# Patient Record
Sex: Female | Born: 1960 | Race: White | Hispanic: No | Marital: Married | Smoking: Never smoker
Health system: Southern US, Community
[De-identification: ages and names within clinical notes are randomized; demographics above are authoritative.]

## PROBLEM LIST (undated history)

## (undated) DIAGNOSIS — F419 Anxiety disorder, unspecified: Secondary | ICD-10-CM

## (undated) DIAGNOSIS — J45909 Unspecified asthma, uncomplicated: Secondary | ICD-10-CM

## (undated) DIAGNOSIS — I10 Essential (primary) hypertension: Secondary | ICD-10-CM

## (undated) DIAGNOSIS — E785 Hyperlipidemia, unspecified: Secondary | ICD-10-CM

## (undated) DIAGNOSIS — C801 Malignant (primary) neoplasm, unspecified: Secondary | ICD-10-CM

## (undated) DIAGNOSIS — M1990 Unspecified osteoarthritis, unspecified site: Secondary | ICD-10-CM

## (undated) DIAGNOSIS — E039 Hypothyroidism, unspecified: Secondary | ICD-10-CM

## (undated) HISTORY — DX: Anxiety disorder, unspecified: F41.9

## (undated) HISTORY — PX: TONSILLECTOMY: SUR1361

## (undated) HISTORY — PX: DIAGNOSTIC LAPAROSCOPY: SUR761

## (undated) HISTORY — DX: Hyperlipidemia, unspecified: E78.5

## (undated) HISTORY — PX: MASTECTOMY: SHX3

## (undated) HISTORY — DX: Unspecified osteoarthritis, unspecified site: M19.90

## (undated) HISTORY — PX: OTHER SURGICAL HISTORY: SHX169

## (undated) HISTORY — PX: TUBAL LIGATION: SHX77

---

## 2012-11-10 ENCOUNTER — Emergency Department: Payer: Self-pay | Admitting: Emergency Medicine

## 2012-11-10 ENCOUNTER — Ambulatory Visit: Payer: Self-pay

## 2012-11-10 LAB — COMPREHENSIVE METABOLIC PANEL
Anion Gap: 5 — ABNORMAL LOW (ref 7–16)
BUN: 13 mg/dL (ref 7–18)
Bilirubin,Total: 0.3 mg/dL (ref 0.2–1.0)
EGFR (African American): >60
EGFR (Non-African Amer.): >60
Glucose: 101 mg/dL — ABNORMAL HIGH (ref 65–99)
SGOT(AST): 17 U/L (ref 15–37)
SGPT (ALT): 22 U/L (ref 12–78)
Sodium: 138 mmol/L (ref 136–145)
Total Protein: 7.8 g/dL (ref 6.4–8.2)

## 2012-11-10 LAB — CBC
HCT: 43.5 % (ref 35.0–47.0)
HGB: 15 g/dL (ref 12.0–16.0)
MCH: 30.3 pg (ref 26.0–34.0)
MCHC: 34.5 g/dL (ref 32.0–36.0)
MCV: 88 fL (ref 80–100)
Platelet: 228 10*3/uL (ref 150–440)
RBC: 4.94 10*6/uL (ref 3.80–5.20)
WBC: 6.3 10*3/uL (ref 3.6–11.0)

## 2012-11-10 LAB — TSH: Thyroid Stimulating Horm: 0.121 u[IU]/mL — ABNORMAL LOW

## 2012-11-10 LAB — T4, FREE: Free Thyroxine: 1.15 ng/dL (ref 0.76–1.46)

## 2012-11-10 LAB — TROPONIN I: Troponin-I: <0.02 ng/mL

## 2013-04-27 ENCOUNTER — Ambulatory Visit: Payer: Self-pay | Admitting: Physician Assistant

## 2015-02-04 ENCOUNTER — Encounter: Payer: Self-pay | Admitting: *Deleted

## 2015-02-07 ENCOUNTER — Ambulatory Visit: Payer: 59 | Admitting: Certified Registered Nurse Anesthetist

## 2015-02-07 ENCOUNTER — Ambulatory Visit
Admission: RE | Admit: 2015-02-07 | Discharge: 2015-02-07 | Disposition: A | Discharge status: Home / Self Care | Payer: 59 | Source: Ambulatory Visit | Attending: Gastroenterology | Admitting: Gastroenterology

## 2015-02-07 ENCOUNTER — Encounter
Admission: RE | Disposition: A | Discharge status: Home / Self Care | Payer: Self-pay | Source: Ambulatory Visit | Attending: Gastroenterology

## 2015-02-07 ENCOUNTER — Encounter: Payer: Self-pay | Admitting: *Deleted

## 2015-02-07 DIAGNOSIS — Z8371 Family history of colonic polyps: Secondary | ICD-10-CM | POA: Diagnosis not present

## 2015-02-07 DIAGNOSIS — E039 Hypothyroidism, unspecified: Secondary | ICD-10-CM | POA: Insufficient documentation

## 2015-02-07 DIAGNOSIS — Z1211 Encounter for screening for malignant neoplasm of colon: Secondary | ICD-10-CM | POA: Diagnosis present

## 2015-02-07 HISTORY — PX: COLONOSCOPY WITH PROPOFOL: SHX5780

## 2015-02-07 HISTORY — DX: Hypothyroidism, unspecified: E03.9

## 2015-02-07 SURGERY — COLONOSCOPY WITH PROPOFOL
Anesthesia: General

## 2015-02-07 MED ORDER — LIDOCAINE HCL (CARDIAC) 20 MG/ML IV SOLN
INTRAVENOUS | Status: DC | PRN
  Administered 2015-02-07: 60 mg via INTRAVENOUS

## 2015-02-07 MED ORDER — SODIUM CHLORIDE 0.9 % IV SOLN: INTRAVENOUS | Status: DC

## 2015-02-07 MED ORDER — SODIUM CHLORIDE 0.9 % IV SOLN
INTRAVENOUS | Status: DC
  Administered 2015-02-07: 13:00:00 via INTRAVENOUS

## 2015-02-07 MED ORDER — MIDAZOLAM HCL 2 MG/2ML IJ SOLN
INTRAMUSCULAR | Status: DC | PRN
  Administered 2015-02-07: 1 mg via INTRAVENOUS

## 2015-02-07 MED ORDER — PROPOFOL 500 MG/50ML IV EMUL
INTRAVENOUS | Status: DC | PRN
  Administered 2015-02-07: 120 ug/kg/min via INTRAVENOUS

## 2015-02-07 MED ORDER — PROPOFOL 10 MG/ML IV BOLUS
INTRAVENOUS | Status: DC | PRN
  Administered 2015-02-07: 40 mg via INTRAVENOUS
  Administered 2015-02-07: 30 mg via INTRAVENOUS

## 2015-02-07 NOTE — H&P (Signed)
    Primary Care Physician:  No PCP Per Patient Primary Gastroenterologist:  Dr. Candace Cruise  Pre-Procedure History & Physical: HPI:  Caroline Lamb is a 54 y.o. female is here for an colonoscopy.  Past Medical History  Diagnosis Date  . Hypothyroidism     Past Surgical History  Procedure Laterality Date  . Tonsillectomy    . Diagnostic laparoscopy    . [place]    . Cesarean      Prior to Admission medications   Medication Sig Start Date End Date Taking? Authorizing Provider  ARMOUR THYROID PO Take by mouth.   Yes Historical Provider, MD    Allergies as of 02/01/2015  . (Not on File)    History reviewed. No pertinent family history.  Social History   Social History  . Marital Status: Married    Spouse Name: N/A  . Number of Children: N/A  . Years of Education: N/A   Occupational History  . Not on file.   Social History Main Topics  . Smoking status: Not on file  . Smokeless tobacco: Not on file  . Alcohol Use: Not on file  . Drug Use: Not on file  . Sexual Activity: Not on file   Other Topics Concern  . Not on file   Social History Narrative  . No narrative on file    Review of Systems: See HPI, otherwise negative ROS  Physical Exam: LMP  General:   Alert,  pleasant and cooperative in NAD Head:  Normocephalic and atraumatic. Neck:  Supple; no masses or thyromegaly. Lungs:  Clear throughout to auscultation.    Heart:  Regular rate and rhythm. Abdomen:  Soft, nontender and nondistended. Normal bowel sounds, without guarding, and without rebound.   Neurologic:  Alert and  oriented x4;  grossly normal neurologically.  Impression/Plan: Caroline Lamb is here for an colonoscopy to be performed for family hx of colon polyps.   Risks, benefits, limitations, and alternatives regarding  colonoscopy have been reviewed with the patient.  Questions have been answered.  All parties agreeable.   Alexx Giambra, Lupita Dawn, MD  02/07/2015, 12:34 PM

## 2015-02-07 NOTE — Anesthesia Procedure Notes (Signed)
Date/Time: 02/07/2015 1:04 PM Performed by: Johnna Acosta Pre-anesthesia Checklist: Patient identified, Emergency Drugs available, Suction available and Patient being monitored Patient Re-evaluated:Patient Re-evaluated prior to inductionOxygen Delivery Method: Nasal cannula

## 2015-02-07 NOTE — Anesthesia Preprocedure Evaluation (Signed)
Anesthesia Evaluation  Patient identified by MRN, date of birth, ID band Patient awake    Reviewed: Allergy & Precautions, H&P , NPO status , Patient's Chart, lab work & pertinent test results  History of Anesthesia Complications Negative for: history of anesthetic complications  Airway Mallampati: II  TM Distance: >3 FB Neck ROM: full    Dental no notable dental hx. (+) Teeth Intact   Pulmonary neg shortness of breath, asthma ,    Pulmonary exam normal breath sounds clear to auscultation       Cardiovascular Exercise Tolerance: Good (-) angina(-) Past MI and (-) DOE negative cardio ROS Normal cardiovascular exam Rhythm:regular Rate:Normal     Neuro/Psych negative neurological ROS  negative psych ROS   GI/Hepatic negative GI ROS, Neg liver ROS, neg GERD  ,  Endo/Other  Hypothyroidism   Renal/GU negative Renal ROS  negative genitourinary   Musculoskeletal   Abdominal   Peds  Hematology negative hematology ROS (+)   Anesthesia Other Findings Past Medical History:   Hypothyroidism                                              Past Surgical History:   TONSILLECTOMY                                                 DIAGNOSTIC LAPAROSCOPY                                        [place]                                                       cesarean                                                     BMI    Body Mass Index   29.27 kg/m 2      Reproductive/Obstetrics negative OB ROS                             Anesthesia Physical Anesthesia Plan  ASA: III  Anesthesia Plan: General   Post-op Pain Management:    Induction:   Airway Management Planned:   Additional Equipment:   Intra-op Plan:   Post-operative Plan:   Informed Consent: I have reviewed the patients History and Physical, chart, labs and discussed the procedure including the risks, benefits and alternatives for the  proposed anesthesia with the patient or authorized representative who has indicated his/her understanding and acceptance.   Dental Advisory Given  Plan Discussed with: Anesthesiologist, CRNA and Surgeon  Anesthesia Plan Comments:         Anesthesia Quick Evaluation

## 2015-02-07 NOTE — Anesthesia Postprocedure Evaluation (Signed)
Anesthesia Post Note  Patient: Caroline Lamb  Procedure(s) Performed: Procedure(s) (LRB): COLONOSCOPY WITH PROPOFOL (N/A)  Patient location during evaluation: Endoscopy Anesthesia Type: General Level of consciousness: awake and alert Pain management: pain level controlled Vital Signs Assessment: post-procedure vital signs reviewed and stable Respiratory status: spontaneous breathing, nonlabored ventilation, respiratory function stable and patient connected to nasal cannula oxygen Cardiovascular status: blood pressure returned to baseline and stable Postop Assessment: no signs of nausea or vomiting Anesthetic complications: no    Last Vitals:  Filed Vitals:   02/07/15 1350 02/07/15 1400  BP: 112/73 113/72  Pulse: 69 71  Temp:    Resp: 23 16    Last Pain: There were no vitals filed for this visit.               Precious Haws Jais Demir

## 2015-02-07 NOTE — Op Note (Signed)
[facility] Patient Name: Caroline Lamb Procedure Date: 02/07/2015 1:04 PM MRN: [id number] Account #: [id number] Date of Birth: 1961-01-29 Admit Type: Outpatient Age: 54 Room: [facility] ENDO ROOM 4 Gender: Female Note Status: Finalized Procedure:         Colonoscopy Indications:       Colon cancer screening in patient at increased risk:                     Family history of 1st-degree relative with colon polyps Providers:         Lupita Dawn. Candace Cruise, MD Referring MD:      Forest Gleason Md, MD (Referring MD) Medicines:         Monitored Anesthesia Care Complications:     No immediate complications. Procedure:         Pre-Anesthesia Assessment:                    - Prior to the procedure, a History and Physical was                     performed, and patient medications, allergies and                     sensitivities were reviewed. The patient's tolerance of                     previous anesthesia was reviewed.                    - The risks and benefits of the procedure and the sedation                     options and risks were discussed with the patient. All                     questions were answered and informed consent was obtained.                    - After reviewing the risks and benefits, the patient was                     deemed in satisfactory condition to undergo the procedure.                    After obtaining informed consent, the colonoscope was                     passed under direct vision. Throughout the procedure, the                     patient's blood pressure, pulse, and oxygen saturations                     were monitored continuously. The Colonoscope was                     introduced through the anus and advanced to the the cecum,                     identified by appendiceal orifice and ileocecal valve. The                     colonoscopy was performed with difficulty due to  significant looping. Successful completion  of the                     procedure was aided by using manual pressure. The patient                     tolerated the procedure well. The quality of the bowel                     preparation was good. Findings:      The colon (entire examined portion) appeared normal. Impression:        - The entire examined colon is normal.                    - No specimens collected. Recommendation:    - Discharge patient to home.                    - Repeat colonoscopy in 5 years for surveillance.                    - The findings and recommendations were discussed with the                     patient. Procedure Code(s): --- Professional ---                    [phone], Colonoscopy, flexible; diagnostic, including                     collection of specimen(s) by brushing or washing, when                     performed (separate procedure) Diagnosis Code(s): --- Professional ---                    Z83.71, Family history of colonic polyps CPT copyright 2014 American Medical Association. All rights reserved. The codes documented in this report are preliminary and upon coder review may  be revised to meet current compliance requirements. Hulen Luster, MD 02/07/2015 1:18:54 PM This report has been signed electronically. Number of Addenda: 0 Note Initiated On: 02/07/2015 1:04 PM Scope Withdrawal Time: 0 hours 3 minutes 7 seconds  Total Procedure Duration: 0 hours 8 minutes 3 seconds       [facility]

## 2015-02-07 NOTE — Transfer of Care (Signed)
Immediate Anesthesia Transfer of Care Note  Patient: Caroline Lamb  Procedure(s) Performed: Procedure(s): COLONOSCOPY WITH PROPOFOL (N/A)  Patient Location: PACU  Anesthesia Type:General  Level of Consciousness: sedated  Airway & Oxygen Therapy: Patient Spontanous Breathing and Patient connected to nasal cannula oxygen  Post-op Assessment: Report given to RN and Post -op Vital signs reviewed and stable  Post vital signs: Reviewed and stable  Last Vitals:  Filed Vitals:   02/07/15 1238  BP: 128/74  Pulse: 77  Temp: 37.1 C  Resp: 24    Complications: No apparent anesthesia complications

## 2015-02-09 ENCOUNTER — Encounter: Payer: Self-pay | Admitting: Gastroenterology

## 2015-02-14 ENCOUNTER — Emergency Department
Admission: EM | Admit: 2015-02-14 | Discharge: 2015-02-15 | Disposition: A | Discharge status: Home / Self Care | Payer: 59 | Attending: Emergency Medicine | Admitting: Emergency Medicine

## 2015-02-14 ENCOUNTER — Encounter: Payer: Self-pay | Admitting: Urgent Care

## 2015-02-14 DIAGNOSIS — K649 Unspecified hemorrhoids: Secondary | ICD-10-CM | POA: Diagnosis not present

## 2015-02-14 DIAGNOSIS — L03314 Cellulitis of groin: Secondary | ICD-10-CM | POA: Insufficient documentation

## 2015-02-14 DIAGNOSIS — R1031 Right lower quadrant pain: Secondary | ICD-10-CM | POA: Diagnosis present

## 2015-02-14 HISTORY — DX: Unspecified asthma, uncomplicated: J45.909

## 2015-02-14 LAB — COMPREHENSIVE METABOLIC PANEL
ALK PHOS: 69 U/L (ref 38–126)
ALT: 22 U/L (ref 14–54)
ANION GAP: 6 (ref 5–15)
AST: 17 U/L (ref 15–41)
Albumin: 4.8 g/dL (ref 3.5–5.0)
BILIRUBIN TOTAL: 0.4 mg/dL (ref 0.3–1.2)
BUN: 17 mg/dL (ref 6–20)
CALCIUM: 9.6 mg/dL (ref 8.9–10.3)
CO2: 28 mmol/L (ref 22–32)
Chloride: 105 mmol/L (ref 101–111)
Creatinine, Ser: 0.39 mg/dL — ABNORMAL LOW (ref 0.44–1.00)
GLUCOSE: 90 mg/dL (ref 65–99)
POTASSIUM: 3.7 mmol/L (ref 3.5–5.1)
SODIUM: 139 mmol/L (ref 135–145)
Total Protein: 7.8 g/dL (ref 6.5–8.1)

## 2015-02-14 LAB — CBC
HEMATOCRIT: 42.4 % (ref 35.0–47.0)
HEMOGLOBIN: 13.9 g/dL (ref 12.0–16.0)
MCH: 28.5 pg (ref 26.0–34.0)
MCHC: 32.8 g/dL (ref 32.0–36.0)
MCV: 86.9 fL (ref 80.0–100.0)
Platelets: 248 10*3/uL (ref 150–440)
RBC: 4.88 MIL/uL (ref 3.80–5.20)
RDW: 13.6 % (ref 11.5–14.5)
WBC: 8.2 10*3/uL (ref 3.6–11.0)

## 2015-02-14 LAB — URINALYSIS COMPLETE WITH MICROSCOPIC (ARMC ONLY)
BACTERIA UA: NONE SEEN
Bilirubin Urine: NEGATIVE
Glucose, UA: NEGATIVE mg/dL
HGB URINE DIPSTICK: NEGATIVE
KETONES UR: NEGATIVE mg/dL
Leukocytes, UA: NEGATIVE
Nitrite: NEGATIVE
PH: 6 (ref 5.0–8.0)
Protein, ur: NEGATIVE mg/dL
RBC / HPF: NONE SEEN RBC/hpf (ref 0–5)
SPECIFIC GRAVITY, URINE: 1.02 (ref 1.005–1.030)
Squamous Epithelial / LPF: NONE SEEN

## 2015-02-14 LAB — LIPASE, BLOOD: Lipase: 19 U/L (ref 11–51)

## 2015-02-14 NOTE — ED Notes (Signed)
Brought over from [facility] with pelvic pain and swelling

## 2015-02-14 NOTE — ED Notes (Signed)
Patient presents with c/o epigastric pain that is through and through to her back. Patient also reporting RIGHT lower abd pain with (+) swelling in her groin. Of note, patient is s/p colonoscopy on Monday - patient attributing symptoms to her procedure.

## 2015-02-14 NOTE — ED Notes (Signed)
Patient report received from Annie Main, RN and Langley Gauss, RN.

## 2015-02-14 NOTE — ED Provider Notes (Addendum)
[facility] Emergency Department Provider Note  ____________________________________________  Time seen: 11:15 PM  I have reviewed the triage vital signs and the nursing notes.   HISTORY  Chief Complaint Abdominal Pain     HPI Caroline Lamb is a 54 y.o. female presents with epigastric/right lower quadrant 8 out of 10 abdominal pain times today. Patient states that she had a colonoscopy performed last Monday for "bowel problems". Patient denies any fever no diarrhea however does admit to nausea.     Past Medical History  Diagnosis Date  . Hypothyroidism     There are no active problems to display for this patient.   Past Surgical History  Procedure Laterality Date  . Tonsillectomy    . Diagnostic laparoscopy    . [place]    . Cesarean    . Colonoscopy with propofol N/A 02/07/2015    Procedure: COLONOSCOPY WITH PROPOFOL;  Surgeon: Hulen Luster, MD;  Location: [facility] ENDOSCOPY;  Service: Gastroenterology;  Laterality: N/A;    Current Outpatient Rx  Name  Route  Sig  Dispense  Refill  . ARMOUR THYROID PO   Oral   Take by mouth.           Allergies Review of patient's allergies indicates no known allergies.  No family history on file.  Social History Social History  Substance Use Topics  . Smoking status: Never Smoker   . Smokeless tobacco: None  . Alcohol Use: No    Review of Systems  Constitutional: Negative for fever. Eyes: Negative for visual changes. ENT: Negative for sore throat. Cardiovascular: Negative for chest pain. Respiratory: Negative for shortness of breath. Gastrointestinal: Positive for abdominal pain Genitourinary: Negative for dysuria. Musculoskeletal: Negative for back pain. Skin: Negative for rash. Neurological: Negative for headaches, focal weakness or numbness.   10-point ROS otherwise negative.  ____________________________________________   PHYSICAL EXAM:  VITAL SIGNS: ED Triage Vitals  Enc  Vitals Group     BP 02/14/15 1919 139/74 mmHg     Pulse Rate 02/14/15 1919 79     Resp 02/14/15 1919 18     Temp 02/14/15 1919 98 F (36.7 C)     Temp Source 02/14/15 1919 Oral     SpO2 02/14/15 1919 98 %     Weight 02/14/15 1919 145 lb (65.772 kg)     Height 02/14/15 1919 4\' 11"  (1.499 m)     Head Cir --      Peak Flow --      Pain Score 02/14/15 1923 8     Pain Loc --      Pain Edu? --      Excl. in [place]? --      Constitutional: Alert and oriented. Well appearing and in no distress. Eyes: Conjunctivae are normal. PERRL. Normal extraocular movements. ENT   Head: Normocephalic and atraumatic.   Nose: No congestion/rhinnorhea.   Mouth/Throat: Mucous membranes are moist.   Neck: No stridor. Hematological/Lymphatic/Immunilogical: No cervical lymphadenopathy. Cardiovascular: Normal rate, regular rhythm. Normal and symmetric distal pulses are present in all extremities. No murmurs, rubs, or gallops. Respiratory: Normal respiratory effort without tachypnea nor retractions. Breath sounds are clear and equal bilaterally. No wheezes/rales/rhonchi. Gastrointestinal: Positive for RLQ/pelvic pain No distention. There is no CVA tenderness. Genitourinary: deferred Musculoskeletal: Nontender with normal range of motion in all extremities. No joint effusions.  No lower extremity tenderness nor edema. Neurologic:  Normal speech and language. No gross focal neurologic deficits are appreciated. Speech is normal.  Skin:  Skin is warm5 x 7 area of blanching erythema and swelling noted right pubis-groin consistent with cellulitis Psychiatric: Mood and affect are normal. Speech and behavior are normal. Patient exhibits appropriate insight and judgment.  ____________________________________________    LABS (pertinent positives/negatives)  Labs Reviewed  COMPREHENSIVE METABOLIC PANEL - Abnormal; Notable for the following:    Creatinine, Ser 0.39 (*)    All other components within normal  limits  URINALYSIS COMPLETEWITH MICROSCOPIC ([facility] ONLY) - Abnormal; Notable for the following:    Color, Urine YELLOW (*)    APPearance CLEAR (*)    All other components within normal limits  LIPASE, BLOOD  CBC  TROPONIN I       RADIOLOGY CT Abdomen Pelvis W Contrast (Final result) Result time: 02/15/15 01:44:22   Final result by Rad Results In Interface (02/15/15 01:44:22)   Narrative:   CLINICAL DATA: 54 year old female with epigastric pain radiating to the back as well as right lower quadrant abdominal pain.  EXAM: CT ABDOMEN AND PELVIS WITH CONTRAST  TECHNIQUE: Multidetector CT imaging of the abdomen and pelvis was performed using the standard protocol following bolus administration of intravenous contrast.  CONTRAST: 151mL OMNIPAQUE IOHEXOL 300 MG/ML SOLN  COMPARISON: None.  FINDINGS: The visualized lung bases are clear. No intra-abdominal free air or free fluid.  The liver, gallbladder, pancreas, and spleen appear unremarkable. A small splenule is noted. The adrenal glands, kidneys, visualized ureters, and urinary bladder appear unremarkable. The uterus is anteverted and grossly unremarkable.  Moderate stool throughout the colon. No evidence of bowel obstruction or inflammation. The appendix is not visualized with certainty. No inflammatory changes identified in the right lower quadrant.  The abdominal aorta and IVC appear patent. No portal venous gas identified. There is no adenopathy. Retro aortic left renal vein variant anatomy.  Small fat containing umbilical hernia. There is mild diffuse stranding of the subcutaneous soft tissues of the right groin with multiple top-normal lymph nodes. No drainable fluid collection/ abscess identified. No hematoma. There is degenerative changes of the spine. No acute fracture.  IMPRESSION: No acute intra-abdominal or pelvic pathology. No evidence of bowel obstruction or inflammation.  Diffuse inflammatory  changes and stranding of the subcutaneous soft tissues of the right groin. No fluid collection or hematoma.   Electronically Signed By: Anner Crete M.D. On: 02/15/2015 01:44         ED ECG REPORT I, Caeleigh Prohaska, [place], the attending physician, personally viewed and interpreted this ECG.   Date: 03/11/2015  EKG Time: 6:28 PM  Rate: 73  Rhythm: Normal sinus rhythm  Axis: None  Intervals: Normal  ST&T Change: None       INITIAL IMPRESSION / ASSESSMENT AND PLAN / ED COURSE  Pertinent labs & imaging results that were available during my care of the patient were reviewed by me and considered in my medical decision making (see chart for details).  5 x 7 area of blanching erythema and swelling noted right pubis-groin consistent with cellulitis. CT scan revealed no underlying abscess as such patient prescribed Keflex 500 mg  ____________________________________________   FINAL CLINICAL IMPRESSION(S) / ED DIAGNOSES  Final diagnoses:  Cellulitis of groin  Hemorrhoids, unspecified hemorrhoid type      Gregor Hams, MD 02/15/15 [place], MD 03/11/15 [phone]

## 2015-02-15 ENCOUNTER — Emergency Department: Payer: 59

## 2015-02-15 MED ORDER — MORPHINE SULFATE (PF) 2 MG/ML IV SOLN
2.0000 mg | Freq: Once | INTRAVENOUS | Status: AC
  Administered 2015-02-15: 2 mg via INTRAVENOUS
  Filled 2015-02-15: qty 1

## 2015-02-15 MED ORDER — ONDANSETRON HCL 4 MG/2ML IJ SOLN
4.0000 mg | Freq: Once | INTRAMUSCULAR | Status: AC
  Administered 2015-02-15: 4 mg via INTRAVENOUS
  Filled 2015-02-15: qty 2

## 2015-02-15 MED ORDER — IOHEXOL 240 MG/ML SOLN
25.0000 mL | Freq: Once | INTRAMUSCULAR | Status: AC | PRN
  Administered 2015-02-15: 25 mL via ORAL

## 2015-02-15 MED ORDER — CEPHALEXIN 500 MG PO CAPS
500.0000 mg | ORAL_CAPSULE | Freq: Once | ORAL | Status: AC
  Administered 2015-02-15: 500 mg via ORAL
  Filled 2015-02-15: qty 1

## 2015-02-15 MED ORDER — CEPHALEXIN 500 MG PO CAPS: 500.0000 mg | ORAL_CAPSULE | Freq: Two times a day (BID) | ORAL | Status: DC

## 2015-02-15 MED ORDER — IOHEXOL 300 MG/ML  SOLN
100.0000 mL | Freq: Once | INTRAMUSCULAR | Status: AC | PRN
  Administered 2015-02-15: 100 mL via INTRAVENOUS

## 2015-02-15 NOTE — Discharge Instructions (Signed)
Cellulitis Cellulitis is an infection of the skin and the tissue beneath it. The infected area is usually red and tender. Cellulitis occurs most often in the arms and lower legs.  CAUSES  Cellulitis is caused by bacteria that enter the skin through cracks or cuts in the skin. The most common types of bacteria that cause cellulitis are staphylococci and streptococci. SIGNS AND SYMPTOMS   Redness and warmth.  Swelling.  Tenderness or pain.  Fever. DIAGNOSIS  Your health care provider can usually determine what is wrong based on a physical exam. Blood tests may also be done. TREATMENT  Treatment usually involves taking an antibiotic medicine. HOME CARE INSTRUCTIONS   Take your antibiotic medicine as directed by your health care provider. Finish the antibiotic even if you start to feel better.  Keep the infected arm or leg elevated to reduce swelling.  Apply a warm cloth to the affected area up to 4 times per day to relieve pain.  Take medicines only as directed by your health care provider.  Keep all follow-up visits as directed by your health care provider. SEEK MEDICAL CARE IF:   You notice red streaks coming from the infected area.  Your red area gets larger or turns dark in color.  Your bone or joint underneath the infected area becomes painful after the skin has healed.  Your infection returns in the same area or another area.  You notice a swollen bump in the infected area.  You develop new symptoms.  You have a fever. SEEK IMMEDIATE MEDICAL CARE IF:   You feel very sleepy.  You develop vomiting or diarrhea.  You have a general ill feeling (malaise) with muscle aches and pains.   This information is not intended to replace advice given to you by your health care provider. Make sure you discuss any questions you have with your health care provider.   Document Released: 11/22/2004 Document Revised: 11/03/2014 Document Reviewed: 04/30/2011 Elsevier Interactive  Patient Education 2016 [employer].  Hemorrhoids Hemorrhoids are swollen veins around the rectum or anus. There are two types of hemorrhoids:   Internal hemorrhoids. These occur in the veins just inside the rectum. They may poke through to the outside and become irritated and painful.  External hemorrhoids. These occur in the veins outside the anus and can be felt as a painful swelling or hard lump near the anus. CAUSES  Pregnancy.   Obesity.   Constipation or diarrhea.   Straining to have a bowel movement.   Sitting for long periods on the toilet.  Heavy lifting or other activity that caused you to strain.  Anal intercourse. SYMPTOMS   Pain.   Anal itching or irritation.   Rectal bleeding.   Fecal leakage.   Anal swelling.   One or more lumps around the anus.  DIAGNOSIS  Your caregiver may be able to diagnose hemorrhoids by visual examination. Other examinations or tests that may be performed include:   Examination of the rectal area with a gloved hand (digital rectal exam).   Examination of anal canal using a small tube (scope).   A blood test if you have lost a significant amount of blood.  A test to look inside the colon (sigmoidoscopy or colonoscopy). TREATMENT Most hemorrhoids can be treated at home. However, if symptoms do not seem to be getting better or if you have a lot of rectal bleeding, your caregiver may perform a procedure to help make the hemorrhoids get smaller or remove them completely. Possible  treatments include:   Placing a rubber band at the base of the hemorrhoid to cut off the circulation (rubber band ligation).   Injecting a chemical to shrink the hemorrhoid (sclerotherapy).   Using a tool to burn the hemorrhoid (infrared light therapy).   Surgically removing the hemorrhoid (hemorrhoidectomy).   Stapling the hemorrhoid to block blood flow to the tissue (hemorrhoid stapling).  HOME CARE INSTRUCTIONS   Eat foods  with fiber, such as whole grains, beans, nuts, fruits, and vegetables. Ask your doctor about taking products with added fiber in them (fibersupplements).  Increase fluid intake. Drink enough water and fluids to keep your urine clear or pale yellow.   Exercise regularly.   Go to the bathroom when you have the urge to have a bowel movement. Do not wait.   Avoid straining to have bowel movements.   Keep the anal area dry and clean. Use wet toilet paper or moist towelettes after a bowel movement.   Medicated creams and suppositories may be used or applied as directed.   Only take over-the-counter or prescription medicines as directed by your caregiver.   Take warm sitz baths for 15-20 minutes, 3-4 times a day to ease pain and discomfort.   Place ice packs on the hemorrhoids if they are tender and swollen. Using ice packs between sitz baths may be helpful.   Put ice in a plastic bag.   Place a towel between your skin and the bag.   Leave the ice on for 15-20 minutes, 3-4 times a day.   Do not use a donut-shaped pillow or sit on the toilet for long periods. This increases blood pooling and pain.  SEEK MEDICAL CARE IF:  You have increasing pain and swelling that is not controlled by treatment or medicine.  You have uncontrolled bleeding.  You have difficulty or you are unable to have a bowel movement.  You have pain or inflammation outside the area of the hemorrhoids. MAKE SURE YOU:  Understand these instructions.  Will watch your condition.  Will get help right away if you are not doing well or get worse.   This information is not intended to replace advice given to you by your health care provider. Make sure you discuss any questions you have with your health care provider.   Document Released: 02/10/2000 Document Revised: 01/30/2012 Document Reviewed: 12/18/2011 Elsevier Interactive Patient Education [employer].

## 2015-07-30 DIAGNOSIS — Z79899 Other long term (current) drug therapy: Secondary | ICD-10-CM | POA: Diagnosis not present

## 2015-07-30 DIAGNOSIS — E039 Hypothyroidism, unspecified: Secondary | ICD-10-CM | POA: Diagnosis not present

## 2015-07-30 DIAGNOSIS — N3001 Acute cystitis with hematuria: Secondary | ICD-10-CM | POA: Insufficient documentation

## 2015-07-30 DIAGNOSIS — J45909 Unspecified asthma, uncomplicated: Secondary | ICD-10-CM | POA: Insufficient documentation

## 2015-07-30 DIAGNOSIS — R319 Hematuria, unspecified: Secondary | ICD-10-CM | POA: Diagnosis present

## 2015-07-30 DIAGNOSIS — R51 Headache: Secondary | ICD-10-CM | POA: Insufficient documentation

## 2015-07-30 NOTE — ED Notes (Signed)
1st RN: Pt c/o hematuria and dysuria starting today, this afternoon. Pt c/o nausea and lower abdominal pain.

## 2015-07-30 NOTE — ED Notes (Signed)
urine sent to lab Lm edt

## 2015-07-30 NOTE — ED Notes (Signed)
Pt ambulatory to triage with no difficulty. Pt reports several days ago she noticed a foul odor to her urine and then noticed pain with urination. Today pt noticed blood in her urine and reports passing clots.

## 2015-07-31 ENCOUNTER — Emergency Department: Payer: 59

## 2015-07-31 ENCOUNTER — Emergency Department
Admission: EM | Admit: 2015-07-31 | Discharge: 2015-07-31 | Disposition: A | Discharge status: Home / Self Care | Payer: 59 | Attending: Emergency Medicine | Admitting: Emergency Medicine

## 2015-07-31 DIAGNOSIS — R319 Hematuria, unspecified: Secondary | ICD-10-CM

## 2015-07-31 DIAGNOSIS — N3001 Acute cystitis with hematuria: Secondary | ICD-10-CM

## 2015-07-31 LAB — CBC
HEMATOCRIT: 38.4 % (ref 35.0–47.0)
Hemoglobin: 12.9 g/dL (ref 12.0–16.0)
MCH: 29.2 pg (ref 26.0–34.0)
MCHC: 33.5 g/dL (ref 32.0–36.0)
MCV: 86.9 fL (ref 80.0–100.0)
Platelets: 239 10*3/uL (ref 150–440)
RBC: 4.42 MIL/uL (ref 3.80–5.20)
RDW: 13.3 % (ref 11.5–14.5)
WBC: 14 10*3/uL — AB (ref 3.6–11.0)

## 2015-07-31 LAB — URINALYSIS COMPLETE WITH MICROSCOPIC (ARMC ONLY)
SQUAMOUS EPITHELIAL / LPF: NONE SEEN
Specific Gravity, Urine: 1.02 (ref 1.005–1.030)

## 2015-07-31 LAB — BASIC METABOLIC PANEL
ANION GAP: 7 (ref 5–15)
BUN: 17 mg/dL (ref 6–20)
CHLORIDE: 107 mmol/L (ref 101–111)
CO2: 26 mmol/L (ref 22–32)
Calcium: 9.1 mg/dL (ref 8.9–10.3)
Creatinine, Ser: 0.48 mg/dL (ref 0.44–1.00)
GFR calc Af Amer: >60 mL/min (ref >60)
GFR calc non Af Amer: >60 mL/min (ref >60)
GLUCOSE: 107 mg/dL — AB (ref 65–99)
POTASSIUM: 3.7 mmol/L (ref 3.5–5.1)
Sodium: 140 mmol/L (ref 135–145)

## 2015-07-31 MED ORDER — DEXTROSE 5 % IV SOLN
1.0000 g | Freq: Once | INTRAVENOUS | Status: AC
  Administered 2015-07-31: 1 g via INTRAVENOUS
  Filled 2015-07-31: qty 10

## 2015-07-31 MED ORDER — CEPHALEXIN 500 MG PO CAPS: 500.0000 mg | ORAL_CAPSULE | Freq: Four times a day (QID) | ORAL | Status: AC

## 2015-07-31 MED ORDER — SODIUM CHLORIDE 0.9 % IV BOLUS (SEPSIS)
1000.0000 mL | Freq: Once | INTRAVENOUS | Status: AC
  Administered 2015-07-31: 1000 mL via INTRAVENOUS

## 2015-07-31 MED ORDER — ONDANSETRON HCL 4 MG/2ML IJ SOLN
4.0000 mg | Freq: Once | INTRAMUSCULAR | Status: AC
  Administered 2015-07-31: 4 mg via INTRAVENOUS
  Filled 2015-07-31: qty 2

## 2015-07-31 NOTE — Discharge Instructions (Signed)
Hematuria, Adult °Hematuria is blood in your urine. It can be caused by a bladder infection, kidney infection, prostate infection, kidney stone, or cancer of your urinary tract. Infections can usually be treated with medicine, and a kidney stone usually will pass through your urine. If neither of these is the cause of your hematuria, further workup to find out the reason may be needed. °It is very important that you tell your health care provider about any blood you see in your urine, even if the blood stops without treatment or happens without causing pain. Blood in your urine that happens and then stops and then happens again can be a symptom of a very serious condition. Also, pain is not a symptom in the initial stages of many urinary cancers. °HOME CARE INSTRUCTIONS  °· Drink lots of fluid, 3-4 quarts a day. If you have been diagnosed with an infection, cranberry juice is especially recommended, in addition to large amounts of water. °· Avoid caffeine, tea, and carbonated beverages because they tend to irritate the bladder. °· Avoid alcohol because it may irritate the prostate. °· Take all medicines as directed by your health care provider. °· If you were prescribed an antibiotic medicine, finish it all even if you start to feel better. °· If you have been diagnosed with a kidney stone, follow your health care provider's instructions regarding straining your urine to catch the stone. °· Empty your bladder often. Avoid holding urine for long periods of time. °· After a bowel movement, women should cleanse front to back. Use each tissue only once. °· Empty your bladder before and after sexual intercourse if you are a female. °SEEK MEDICAL CARE IF: °· You develop back pain. °· You have a fever. °· You have a feeling of sickness in your stomach (nausea) or vomiting. °· Your symptoms are not better in 3 days. Return sooner if you are getting worse. °SEEK IMMEDIATE MEDICAL CARE IF:  °· You develop severe vomiting and  are unable to keep the medicine down. °· You develop severe back or abdominal pain despite taking your medicines. °· You begin passing a large amount of blood or clots in your urine. °· You feel extremely weak or faint, or you pass out. °MAKE SURE YOU:  °· Understand these instructions. °· Will watch your condition. °· Will get help right away if you are not doing well or get worse. °  °This information is not intended to replace advice given to you by your health care provider. Make sure you discuss any questions you have with your health care provider. °  °Document Released: 02/12/2005 Document Revised: 03/05/2014 Document Reviewed: 10/13/2012 °Elsevier Interactive Patient Education ©2016 [employer] ° °Urinary Tract Infection °Urinary tract infections (UTIs) can develop anywhere along your urinary tract. Your urinary tract is your body's drainage system for removing wastes and extra water. Your urinary tract includes two kidneys, two ureters, a bladder, and a urethra. Your kidneys are a pair of bean-shaped organs. Each kidney is about the size of your fist. They are located below your ribs, one on each side of your spine. °CAUSES °Infections are caused by microbes, which are microscopic organisms, including fungi, viruses, and bacteria. These organisms are so small that they can only be seen through a microscope. Bacteria are the microbes that most commonly cause UTIs. °SYMPTOMS  °Symptoms of UTIs may vary by age and gender of the patient and by the location of the infection. Symptoms in young women typically include a frequent   and intense urge to urinate and a painful, burning feeling in the bladder or urethra during urination. Older women and men are more likely to be tired, shaky, and weak and have muscle aches and abdominal pain. A fever may mean the infection is in your kidneys. Other symptoms of a kidney infection include pain in your back or sides below the ribs, nausea, and vomiting. °DIAGNOSIS °To  diagnose a UTI, your caregiver will ask you about your symptoms. Your caregiver will also ask you to provide a urine sample. The urine sample will be tested for bacteria and white blood cells. White blood cells are made by your body to help fight infection. °TREATMENT  °Typically, UTIs can be treated with medication. Because most UTIs are caused by a bacterial infection, they usually can be treated with the use of antibiotics. The choice of antibiotic and length of treatment depend on your symptoms and the type of bacteria causing your infection. °HOME CARE INSTRUCTIONS °· If you were prescribed antibiotics, take them exactly as your caregiver instructs you. Finish the medication even if you feel better after you have only taken some of the medication. °· Drink enough water and fluids to keep your urine clear or pale yellow. °· Avoid caffeine, tea, and carbonated beverages. They tend to irritate your bladder. °· Empty your bladder often. Avoid holding urine for long periods of time. °· Empty your bladder before and after sexual intercourse. °· After a bowel movement, women should cleanse from front to back. Use each tissue only once. °SEEK MEDICAL CARE IF:  °· You have back pain. °· You develop a fever. °· Your symptoms do not begin to resolve within 3 days. °SEEK IMMEDIATE MEDICAL CARE IF:  °· You have severe back pain or lower abdominal pain. °· You develop chills. °· You have nausea or vomiting. °· You have continued burning or discomfort with urination. °MAKE SURE YOU:  °· Understand these instructions. °· Will watch your condition. °· Will get help right away if you are not doing well or get worse. °  °This information is not intended to replace advice given to you by your health care provider. Make sure you discuss any questions you have with your health care provider. °  °Document Released: 11/22/2004 Document Revised: 11/03/2014 Document Reviewed: 03/23/2011 °Elsevier Interactive Patient Education ©2016  Elsevier Inc. ° °

## 2015-07-31 NOTE — ED Notes (Signed)
Pt c/o burning/pain with urination beginning this afternoon. Pt reports her urine to be red/bloody, with blood clots. Pt c/o nausea beginning Thursday, denies vomiting. PT c/o of intermittent numbness in right arm X 2 days

## 2015-07-31 NOTE — ED Notes (Signed)
Pt using call bell; up to toilet in room to void

## 2015-07-31 NOTE — ED Provider Notes (Signed)
[facility] Emergency Department Provider Note   ____________________________________________  Time seen: Approximately 112 AM  I have reviewed the triage vital signs and the nursing notes.   HISTORY  Chief Complaint Hematuria    HPI Caroline Lamb is a 55 y.o. female who comes into the hospital today with some urinating blood. The patient reports that for the past week her urine has had a strong smell to it and she's had some urgency but the bleeding started today. The patient also had some clots in her urine. The patient has had some left-sided abdominal pressure with some mild pain. She also reports that she's had some burning with urination. The burning seems to be stronger when she finishes urinating. The patient has had no fevers but she's had chills and nausea and she has been tired. The patient was concerned when she noticed blood so she decided to come in for evaluation. The patient also reports she's never had this happen before. The patient has had some left arm numbness most of the week with some intermittent left face tingling as well. She reports that that symptom is coming and going. The patient is here for evaluation.The patient rates her pain an 8 out of 10 in intensity   Past Medical History  Diagnosis Date  . Hypothyroidism   . Asthma     There are no active problems to display for this patient.   Past Surgical History  Procedure Laterality Date  . Tonsillectomy    . Diagnostic laparoscopy    . Cearsen    . Cesarean    . Colonoscopy with propofol N/A 02/07/2015    Procedure: COLONOSCOPY WITH PROPOFOL;  Surgeon: Hulen Luster, MD;  Location: [facility] ENDOSCOPY;  Service: Gastroenterology;  Laterality: N/A;    Current Outpatient Rx  Name  Route  Sig  Dispense  Refill  . ARMOUR THYROID PO   Oral   Take by mouth.         . cephALEXin (KEFLEX) 500 MG capsule   Oral   Take 1 capsule (500 mg total) by mouth 2 (two) times daily.   20  capsule   0   . cephALEXin (KEFLEX) 500 MG capsule   Oral   Take 1 capsule (500 mg total) by mouth 4 (four) times daily.   40 capsule   0     Allergies Review of patient's allergies indicates no known allergies.  No family history on file.  Social History Social History  Substance Use Topics  . Smoking status: Never Smoker   . Smokeless tobacco: Not on file  . Alcohol Use: No    Review of Systems Constitutional: No fever/chills Eyes: No visual changes. ENT: No sore throat. Cardiovascular: Denies chest pain. Respiratory: Denies shortness of breath. Gastrointestinal:  abdominal pain.  No nausea, no vomiting.  No diarrhea.  No constipation. Genitourinary: Hematuria, urgency, foul smell, dysuria. Musculoskeletal: Negative for back pain. Skin: Negative for rash. Neurological: Numbness to left arm and tingling to left face.  10-point ROS otherwise negative.  ____________________________________________   PHYSICAL EXAM:  VITAL SIGNS: ED Triage Vitals  Enc Vitals Group     BP 07/30/15 2317 139/79 mmHg     Pulse Rate 07/30/15 2317 79     Resp 07/30/15 2317 18     Temp 07/30/15 2317 98.7 F (37.1 C)     Temp src --      SpO2 07/30/15 2317 100 %     Weight 07/30/15 2317  155 lb (70.308 kg)     Height 07/30/15 2317 4\' 11"  (1.499 m)     Head Cir --      Peak Flow --      Pain Score 07/30/15 2318 8     Pain Loc --      Pain Edu? --      Excl. in [place]? --     Constitutional: Alert and oriented. Well appearing and in Moderate distress. Eyes: Conjunctivae are normal. PERRL. EOMI. Head: Atraumatic. Nose: No congestion/rhinnorhea. Mouth/Throat: Mucous membranes are moist.  Oropharynx non-erythematous. Cardiovascular: Normal rate, regular rhythm. Grossly normal heart sounds.  Good peripheral circulation. Respiratory: Normal respiratory effort.  No retractions. Lungs CTAB. Gastrointestinal: Soft with some left lower quadrant and suprapubic tenderness to palpation No  distention. Positive bowel sounds Musculoskeletal: No lower extremity tenderness nor edema.   Neurologic:  Normal speech and language. Skin:  Skin is warm, dry and intact.  Psychiatric: Mood and affect are normal.   ____________________________________________   LABS (all labs ordered are listed, but only abnormal results are displayed)  Labs Reviewed  URINALYSIS COMPLETEWITH MICROSCOPIC ([place]) - Abnormal; Notable for the following:    Color, Urine RED (*)    APPearance CLOUDY (*)    Glucose, UA   (*)    Value: TEST NOT REPORTED DUE TO COLOR INTERFERENCE OF URINE PIGMENT   Bilirubin Urine   (*)    Value: TEST NOT REPORTED DUE TO COLOR INTERFERENCE OF URINE PIGMENT   Ketones, ur   (*)    Value: TEST NOT REPORTED DUE TO COLOR INTERFERENCE OF URINE PIGMENT   Hgb urine dipstick   (*)    Value: TEST NOT REPORTED DUE TO COLOR INTERFERENCE OF URINE PIGMENT   Protein, ur   (*)    Value: TEST NOT REPORTED DUE TO COLOR INTERFERENCE OF URINE PIGMENT   Nitrite   (*)    Value: TEST NOT REPORTED DUE TO COLOR INTERFERENCE OF URINE PIGMENT   Leukocytes, UA   (*)    Value: TEST NOT REPORTED DUE TO COLOR INTERFERENCE OF URINE PIGMENT   Bacteria, UA MANY (*)    All other components within normal limits  CBC - Abnormal; Notable for the following:    WBC 14.0 (*)    All other components within normal limits  BASIC METABOLIC PANEL - Abnormal; Notable for the following:    Glucose, Bld 107 (*)    All other components within normal limits  URINE CULTURE   ____________________________________________  EKG  none ____________________________________________  RADIOLOGY  CT head: Negative head CT, no acute intracranial process identified  CT renal stone study: No CT evidence for nephrolithiasis or obstructive uropathy, no other acute intra abdominal or pelvic process identified. ____________________________________________   PROCEDURES  Procedure(s) performed: None  Critical Care  performed: No  ____________________________________________   INITIAL IMPRESSION / ASSESSMENT AND PLAN / ED COURSE  Pertinent labs & imaging results that were available during my care of the patient were reviewed by me and considered in my medical decision making (see chart for details).  This is a 55 year old female who comes into the hospital today with some hematuria. The patient has an elevated white blood cell count and has many bacteria with too numerous to count white blood cells and red blood cells in her urine. I will send the patient for a CT scan to evaluate for a kidney stone. The patient receive a dose of ceftriaxone and a liter of normal saline.  The  patient's CT scan does not show a kidney stone. The patient will be discharged home. I have encouraged her to increase her hydration and I will discharge her with some antibiotics. The patient should follow-up with her primary care physician. ____________________________________________   FINAL CLINICAL IMPRESSION(S) / ED DIAGNOSES  Final diagnoses:  Hematuria  Acute cystitis with hematuria      NEW MEDICATIONS STARTED DURING THIS VISIT:  Discharge Medication List as of 07/31/2015  3:37 AM    START taking these medications   Details  !! cephALEXin (KEFLEX) 500 MG capsule Take 1 capsule (500 mg total) by mouth 4 (four) times daily., Starting 07/31/2015, Until Wed 08/10/15, Print     !! - Potential duplicate medications found. Please discuss with provider.       Note:  This document was prepared using Dragon voice recognition software and may include unintentional dictation errors.    Loney Hering, MD 07/31/15 [phone]

## 2015-07-31 NOTE — ED Notes (Signed)
While MD at bedside pt adds she's had left arm numbness and headache for a week; pt talking in complete coherent sentences

## 2015-08-02 LAB — URINE CULTURE

## 2015-11-22 ENCOUNTER — Other Ambulatory Visit: Payer: Self-pay | Admitting: Student

## 2015-11-22 DIAGNOSIS — M25471 Effusion, right ankle: Secondary | ICD-10-CM

## 2015-12-05 ENCOUNTER — Ambulatory Visit: Payer: 59

## 2015-12-13 ENCOUNTER — Ambulatory Visit
Admission: RE | Admit: 2015-12-13 | Discharge: 2015-12-13 | Disposition: A | Discharge status: Home / Self Care | Payer: 59 | Source: Ambulatory Visit | Attending: Student | Admitting: Student

## 2015-12-13 DIAGNOSIS — S96821A Laceration of other specified muscles and tendons at ankle and foot level, right foot, initial encounter: Secondary | ICD-10-CM | POA: Insufficient documentation

## 2015-12-13 DIAGNOSIS — M25471 Effusion, right ankle: Secondary | ICD-10-CM

## 2015-12-13 DIAGNOSIS — X58XXXA Exposure to other specified factors, initial encounter: Secondary | ICD-10-CM | POA: Diagnosis not present

## 2016-01-04 ENCOUNTER — Other Ambulatory Visit: Payer: Self-pay | Admitting: Surgery

## 2016-01-04 ENCOUNTER — Other Ambulatory Visit (HOSPITAL_COMMUNITY): Payer: Self-pay | Admitting: Surgery

## 2016-01-04 DIAGNOSIS — D2372 Other benign neoplasm of skin of left lower limb, including hip: Secondary | ICD-10-CM

## 2016-01-13 ENCOUNTER — Ambulatory Visit: Payer: 59

## 2016-01-24 ENCOUNTER — Other Ambulatory Visit: Payer: 59

## 2016-02-02 ENCOUNTER — Ambulatory Visit: Admit: 2016-02-02 | Payer: 59 | Admitting: Surgery

## 2016-02-02 SURGERY — REPAIR, TENDON, PERONEUS BREVIS
Anesthesia: Choice | Laterality: Right

## 2016-03-23 ENCOUNTER — Other Ambulatory Visit: Payer: Self-pay | Admitting: Nurse Practitioner

## 2016-03-23 DIAGNOSIS — R109 Unspecified abdominal pain: Secondary | ICD-10-CM

## 2016-03-26 ENCOUNTER — Ambulatory Visit: Payer: 59

## 2016-09-14 ENCOUNTER — Encounter: Payer: Self-pay | Admitting: *Deleted

## 2016-09-14 ENCOUNTER — Ambulatory Visit
Admission: EM | Admit: 2016-09-14 | Discharge: 2016-09-14 | Disposition: A | Discharge status: Home / Self Care | Payer: Self-pay | Attending: Family Medicine | Admitting: Family Medicine

## 2016-09-14 DIAGNOSIS — R079 Chest pain, unspecified: Secondary | ICD-10-CM | POA: Insufficient documentation

## 2016-09-14 DIAGNOSIS — R109 Unspecified abdominal pain: Secondary | ICD-10-CM | POA: Insufficient documentation

## 2016-09-14 DIAGNOSIS — J029 Acute pharyngitis, unspecified: Secondary | ICD-10-CM | POA: Insufficient documentation

## 2016-09-14 DIAGNOSIS — R350 Frequency of micturition: Secondary | ICD-10-CM | POA: Insufficient documentation

## 2016-09-14 DIAGNOSIS — E039 Hypothyroidism, unspecified: Secondary | ICD-10-CM | POA: Insufficient documentation

## 2016-09-14 DIAGNOSIS — R0789 Other chest pain: Secondary | ICD-10-CM

## 2016-09-14 DIAGNOSIS — N390 Urinary tract infection, site not specified: Secondary | ICD-10-CM | POA: Insufficient documentation

## 2016-09-14 LAB — URINALYSIS, COMPLETE (UACMP) WITH MICROSCOPIC
BILIRUBIN URINE: NEGATIVE
Glucose, UA: NEGATIVE mg/dL
Hgb urine dipstick: NEGATIVE
KETONES UR: NEGATIVE mg/dL
LEUKOCYTES UA: NEGATIVE
Nitrite: NEGATIVE
PH: 5 (ref 5.0–8.0)
Protein, ur: NEGATIVE mg/dL
Specific Gravity, Urine: >1.03 — ABNORMAL HIGH (ref 1.005–1.030)

## 2016-09-14 LAB — RAPID STREP SCREEN (MED CTR MEBANE ONLY): STREPTOCOCCUS, GROUP A SCREEN (DIRECT): NEGATIVE

## 2016-09-14 MED ORDER — CEPHALEXIN 500 MG PO CAPS: 500.0000 mg | ORAL_CAPSULE | Freq: Two times a day (BID) | ORAL | 0 refills | Status: AC

## 2016-09-14 NOTE — ED Triage Notes (Signed)
Chest pain, right flank pain, urinary frequency, fever, nausea, fatigue, x2 days.

## 2016-09-14 NOTE — Discharge Instructions (Signed)
-  Kelfex: one tablet twice a day for 5 days -push fluids -Tylenol and ibuprofen for pain -need to follow up with PCP and endocrinology next week as several of you symptoms could be related to your thyroid. -return to clinic should symptoms worsen or not improve.

## 2016-09-14 NOTE — ED Provider Notes (Signed)
CSN: [id number]     Arrival date & time 09/14/16  1620 History   First MD Initiated Contact with Patient 09/14/16 1655     Chief Complaint  Patient presents with  . Chest Pain  . Sore Throat  . Fever  . Flank Pain  . Urinary Frequency   (Consider location/radiation/quality/duration/timing/severity/associated sxs/prior Treatment) Patient is a 56 year old female who presents with complaint of sore throat, chest pain, flank pain, urinary frequency, dizziness, feeling tired, and nausea. Patient states her right kidney pain has been ongoing for about 3 weeks and has been constant but that the left flank pain started yesterday. Patient reports that the left pain also feels like she has a little bit of swelling at the pain is worse with bending over and walking. Patient reports nausea, dizziness, and feeling very tired. She does report a UTI about a month ago that she was treated for by her endocrinologist and that she actually had blood in her urine at that time. Patient reports that her TSH has been up and down but that there have been no changes to her medications. Patient also reports some edema in her legs and some intermittent swelling in her hands.  There is no note from her endocrinologist in [place] or care everywhere. Last time she was seen by endocrinology was last year. There is documentation of her being seen by family medicine in the ER this past January. CT looking for renal stones was negative but did show a rotation of the right kidney. Reading through the history of present illness of the ER visit, her complaint then is almost word for word where she told me today.       Past Medical History:  Diagnosis Date  . Asthma   . Hypothyroidism    Past Surgical History:  Procedure Laterality Date  . cearsen    . cesarean    . COLONOSCOPY WITH PROPOFOL N/A 02/07/2015   Procedure: COLONOSCOPY WITH PROPOFOL;  Surgeon: Hulen Luster, MD;  Location: [facility] ENDOSCOPY;  Service: Gastroenterology;   Laterality: N/A;  . DIAGNOSTIC LAPAROSCOPY    . TONSILLECTOMY     History reviewed. No pertinent family history. Social History  Substance Use Topics  . Smoking status: Never Smoker  . Smokeless tobacco: Never Used  . Alcohol use No   OB History    No data available     Review of Systems  As noted above in history of present illness  Allergies  Patient has no known allergies.  Home Medications   Prior to Admission medications   Medication Sig Start Date End Date Taking? Authorizing Provider  ARMOUR THYROID PO Take by mouth.   Yes [provider]  cephALEXin (KEFLEX) 500 MG capsule Take 1 capsule (500 mg total) by mouth 2 (two) times daily. 02/15/15   Gregor Hams, MD   Meds Ordered and Administered this Visit  Medications - No data to display  BP 112/65 (BP Location: Left Arm)   Pulse 78   Temp 98.5 F (36.9 C) (Oral)   Resp 16   Ht 4\' 11"  (1.499 m)   Wt 160 lb (72.6 kg)   SpO2 100%   BMI 32.32 kg/m  No data found.   Physical Exam  Constitutional: She is oriented to person, place, and time. She appears well-developed and well-nourished. No distress.  HENT:  Head: Normocephalic and atraumatic.  Eyes: Pupils are equal, round, and reactive to light. EOM are normal.  Neck: Normal range of motion.  Cardiovascular: Normal rate, regular rhythm and normal heart sounds.   Pulmonary/Chest: Effort normal and breath sounds normal.  Abdominal: Soft. Bowel sounds are normal. She exhibits no mass. There is no rebound and no guarding.  Some bilateral flank/CVA tenderness to palpation. No abdominal tenderness noted. No tenderness above the bladder.  Musculoskeletal: Normal range of motion. She exhibits edema (minimal bilateral lower extremity).  Neurological: She is alert and oriented to person, place, and time. No cranial nerve deficit.  Skin: Skin is warm and dry.  Psychiatric: She has a normal mood and affect. Her behavior is normal.    Urgent Care Course      Procedures (including critical care time)  Labs Review Labs Reviewed  URINALYSIS, COMPLETE (UACMP) WITH MICROSCOPIC - Abnormal; Notable for the following:       Result Value   APPearance HAZY (*)    Specific Gravity, Urine >1.030 (*)    Squamous Epithelial / LPF 0-5 (*)    Bacteria, UA FEW (*)    All other components within normal limits  RAPID STREP SCREEN (NOT AT [facility])  CULTURE, GROUP A STREP [facility])  URINE CULTURE    Imaging Review No results found.  ED ECG REPORT   Date: 09/14/2016  EKG Time: 5:29 PM  Rate: 77  Rhythm: normal sinus rhythm,1st degree AV block  Intervals:first-degree A-V block   ST&T Change: none  Narrative Interpretation: NSR with first degree AVB, otherwise normal ECG   MDM   1. Urinary tract infection without hematuria, site unspecified   2. Hypothyroidism, unspecified type    Patient presents with complaint of urinary frequency as well as bilateral flank pain. Presentation symptoms are basically word for word from a January visit to the ER and to her primary care provider. At that time CT showed no renal stones and culture was negative. She does have a few bacteria in her UA today but no nitrites and no leukocyte esterase. Patient complaint of dizziness feeling tired and nausea could be related to her thyroid. She states she was seen about to go by her endocrinologist but the last note from Dr. Eddie Dibbles was from May 2017. Ahead and treat her with Keflex for her symptoms regarding her UTI and recommend that she follow with her primary care provider and Dr. Eddie Dibbles next week. Recommend pushing fluids and she is Advil or Tylenol for pain.  Luvenia Redden, PA-C     Luvenia Redden, PA-C 09/14/16 1734

## 2016-09-16 LAB — URINE CULTURE

## 2016-09-17 LAB — CULTURE, GROUP A STREP (THRC)

## 2016-10-08 DIAGNOSIS — E038 Other specified hypothyroidism: Secondary | ICD-10-CM | POA: Insufficient documentation

## 2017-01-04 ENCOUNTER — Other Ambulatory Visit: Payer: Self-pay

## 2017-01-04 ENCOUNTER — Ambulatory Visit
Admission: EM | Admit: 2017-01-04 | Discharge: 2017-01-04 | Disposition: A | Discharge status: Home / Self Care | Payer: 59 | Attending: Family Medicine | Admitting: Family Medicine

## 2017-01-04 DIAGNOSIS — R350 Frequency of micturition: Secondary | ICD-10-CM | POA: Diagnosis present

## 2017-01-04 DIAGNOSIS — R3915 Urgency of urination: Secondary | ICD-10-CM | POA: Diagnosis not present

## 2017-01-04 LAB — URINALYSIS, COMPLETE (UACMP) WITH MICROSCOPIC
Bacteria, UA: NONE SEEN
Bilirubin Urine: NEGATIVE
GLUCOSE, UA: NEGATIVE mg/dL
HGB URINE DIPSTICK: NEGATIVE
Ketones, ur: NEGATIVE mg/dL
Leukocytes, UA: NEGATIVE
NITRITE: NEGATIVE
PH: 5.5 (ref 5.0–8.0)
Protein, ur: NEGATIVE mg/dL
Specific Gravity, Urine: >1.03 — ABNORMAL HIGH (ref 1.005–1.030)
Squamous Epithelial / LPF: NONE SEEN

## 2017-01-04 NOTE — ED Provider Notes (Signed)
[facility]    CSN: [id number] Arrival date & time: 01/04/17  1224     History   Chief Complaint Chief Complaint  Patient presents with  . Urinary Urgency    HPI Caroline Lamb is a 56 y.o. female.   56 YR old female presents to UC with cc of urinary frequency x months, had urology referral and cancelled, pt requesting referral for endocrinologist Eddie Dibbles, needs PCP. Advised pt we would check her urine for UTI and refer to other specialists and PCP. Pt denies any fever or abd pain.    The history is provided by the patient. No language interpreter was used.    Past Medical History:  Diagnosis Date  . Asthma   . Hypothyroidism     Patient Active Problem List   Diagnosis Date Noted  . Urinary frequency 01/04/2017    Past Surgical History:  Procedure Laterality Date  . cearsen    . cesarean    . DIAGNOSTIC LAPAROSCOPY    . TONSILLECTOMY      OB History    No data available       Home Medications    Prior to Admission medications   Medication Sig Start Date End Date Taking? Authorizing Provider  levothyroxine (SYNTHROID, LEVOTHROID) 125 MCG tablet Take 125 mcg daily before breakfast by mouth.   Yes [provider]  ARMOUR THYROID PO Take by mouth.    [provider]  cephALEXin (KEFLEX) 500 MG capsule Take 1 capsule (500 mg total) by mouth 2 (two) times daily. 02/15/15   Gregor Hams, MD    Family History History reviewed. No pertinent family history.  Social History Social History   Tobacco Use  . Smoking status: Never Smoker  . Smokeless tobacco: Never Used  Substance Use Topics  . Alcohol use: No  . Drug use: No     Allergies   Patient has no known allergies.   Review of Systems Review of Systems  Constitutional: Negative for chills and fever.  HENT: Negative for ear pain and sore throat.   Eyes: Negative for pain and visual disturbance.  Respiratory: Negative for cough and shortness of breath.     Cardiovascular: Negative for chest pain and palpitations.  Gastrointestinal: Negative for abdominal pain and vomiting.  Genitourinary: Negative for dysuria and hematuria.  Musculoskeletal: Negative for arthralgias and back pain.  Skin: Negative for color change and rash.  Neurological: Negative for seizures and syncope.  All other systems reviewed and are negative.    Physical Exam Triage Vital Signs ED Triage Vitals  Enc Vitals Group     BP 01/04/17 1305 120/70     Pulse Rate 01/04/17 1305 68     Resp 01/04/17 1305 18     Temp 01/04/17 1305 97.8 F (36.6 C)     Temp Source 01/04/17 1305 Oral     SpO2 01/04/17 1305 100 %     Weight 01/04/17 1302 163 lb (73.9 kg)     Height 01/04/17 1302 4\' 11"  (1.499 m)     Head Circumference --      Peak Flow --      Pain Score 01/04/17 1302 8     Pain Loc --      Pain Edu? --      Excl. in [place]? --    No data found.  Updated Vital Signs BP 120/70 (BP Location: Left Arm)   Pulse 68   Temp 97.8 F (36.6 C) (Oral)  Resp 18   Ht 4\' 11"  (1.499 m)   Wt 163 lb (73.9 kg)   SpO2 100%   BMI 32.92 kg/m   Visual Acuity Right Eye Distance:   Left Eye Distance:   Bilateral Distance:    Right Eye Near:   Left Eye Near:    Bilateral Near:     Physical Exam  Constitutional: She is oriented to person, place, and time. She appears well-developed and well-nourished. She is active and cooperative. No distress.  HENT:  Head: Normocephalic and atraumatic.  Eyes: Conjunctivae are normal.  Neck: Neck supple.  Cardiovascular: Normal rate and regular rhythm.  No murmur heard. Pulmonary/Chest: Effort normal and breath sounds normal. No respiratory distress.  Abdominal: Soft. There is no tenderness.  Musculoskeletal: She exhibits no edema.  Neurological: She is alert and oriented to person, place, and time. GCS eye subscore is 4. GCS verbal subscore is 5. GCS motor subscore is 6.  Skin: Skin is warm and dry.  Psychiatric: She has a normal mood  and affect. Her speech is normal.  Nursing note and vitals reviewed.    UC Treatments / Results  Labs (all labs ordered are listed, but only abnormal results are displayed) Labs Reviewed  URINALYSIS, COMPLETE (UACMP) WITH MICROSCOPIC - Abnormal; Notable for the following components:      Result Value   Specific Gravity, Urine >1.030 (*)    All other components within normal limits  URINE CULTURE    EKG  EKG Interpretation None       Radiology No results found.  Procedures Procedures (including critical care time)  Medications Ordered in UC Medications - No data to display   Initial Impression / Assessment and Plan / UC Course  I have reviewed the triage vital signs and the nursing notes.  Pertinent labs & imaging results that were available during my care of the patient were reviewed by me and considered in my medical decision making (see chart for details).    YOUR URINE WAS SENT FOR CULTURE. PLEASE FOLLOW UP WITH PCP/ENDOCRINOLOGIST,UROLOGIST AS RECOMMENDED. GO TO ER FOR NEW OR WORSENING ISSUES OR CONCERNS. VITAL SIGNS ARE STABLE IN URGENT CARE TODAY.    Final Clinical Impressions(s) / UC Diagnoses   Final diagnoses:  Urinary frequency    ED Discharge Orders    None       Controlled Substance Prescriptions    Aamani Moose, Jeanett Schlein, NP 10/62/69 1444

## 2017-01-04 NOTE — ED Triage Notes (Signed)
Patient complains of lower back pain, urinary urgency, leg pain, fatigue, dizzy, no energy and nausea, Urinary frequency x 1 month. Patient states that she was supposed to see a urologist but cancelled appointment because she couldn't make it. Patient would also like a name of a primary MD.

## 2017-01-04 NOTE — Discharge Instructions (Signed)
YOUR URINE WAS SENT FOR CULTURE. PLEASE FOLLOW UP WITH PCP/ENDOCRINOLOGIST,UROLOGIST AS RECOMMENDED. GO TO ER FOR NEW OR WORSENING ISSUES OR CONCERNS. VITAL SIGNS ARE STABLE IN URGENT CARE TODAY.

## 2017-01-24 NOTE — Progress Notes (Signed)
01/25/2017 2:16 PM   Caroline Lamb Jul 10, 1960 [id number]  Referring provider: Katheren Shams [address] [place], [place] [address]  Chief Complaint  Patient presents with  . Abdominal Pain    HPI: Patient is a 56 -year-old [place] female who is referred to [place] by, [place] Ravens, NP, for urinary frequency.    She states she has been having right flank pain for 6 months.  It does not radiate.  She has been having gross hematuria.  She is experiencing frequency, nocturia, urge incontinence and intermittency.  She denies dysuria, suprapubic pain, back pain or abdominal pain.  She has not had any recent fevers, chills, nausea or vomiting.   She does not have a history of nephrolithiasis, GU surgery or GU trauma.  She is peri menopausal.  She admits to constipation.  She does engage in good perineal hygiene. She does not take tub baths.   She is not having pain with bladder filling.     She had a non contrast study in 2017 which noted kidneys are equal in size without evidence of nephrolithiasis or hydronephrosis. No radiopaque calculi seen along the course of either ureter. No hydroureter.  She is drinking 6 to 7 bottles of water daily.  No sodas.  One cup of coffee daily.  Cranberry juice daily.  Occasional alcohol.   Reviewed referral notes.    PMH: Past Medical History:  Diagnosis Date  . Anxiety   . Arthritis   . Asthma   . Hyperlipidemia   . Hypothyroidism     Surgical History: Past Surgical History:  Procedure Laterality Date  . cesarean     x 2  . COLONOSCOPY WITH PROPOFOL N/A 02/07/2015   Procedure: COLONOSCOPY WITH PROPOFOL;  Surgeon: Hulen Luster, MD;  Location: [facility] ENDOSCOPY;  Service: Gastroenterology;  Laterality: N/A;  . DIAGNOSTIC LAPAROSCOPY    . TONSILLECTOMY      Home Medications:  Allergies as of 01/25/2017   No Known Allergies     Medication List        Accurate as of 01/25/17 11:59 PM. Always use your most recent  med list.          BC FAST PAIN RELIEF 845-65 MG Pack Generic drug:  Aspirin-Caffeine Take 1 Package by mouth daily.   levothyroxine 125 MCG tablet Commonly known as:  SYNTHROID, LEVOTHROID Take 125 mcg daily before breakfast by mouth.       Allergies: No Known Allergies  Family History: Family History  Problem Relation Age of Onset  . Hematuria Father   . Prostate cancer Father   . Hematuria Mother   . Tuberculosis Paternal Grandfather   . Kidney cancer Neg Hx   . Kidney disease Neg Hx   . Sickle cell trait Neg Hx     Social History:  reports that  has never smoked. she has never used smokeless tobacco. She reports that she does not drink alcohol or use drugs.  ROS: UROLOGY Frequent Urination?: Yes Hard to postpone urination?: No Burning/pain with urination?: No Get up at night to urinate?: Yes Leakage of urine?: Yes Urine stream starts and stops?: Yes Trouble starting stream?: No Do you have to strain to urinate?: No Blood in urine?: Yes Urinary tract infection?: No Sexually transmitted disease?: No Injury to kidneys or bladder?: No Painful intercourse?: No Weak stream?: No Currently pregnant?: No Vaginal bleeding?: No  Gastrointestinal Nausea?: Yes Vomiting?: Yes Indigestion/heartburn?: Yes Diarrhea?: No Constipation?: Yes  Constitutional Fever: No  Night sweats?: Yes Weight loss?: No Fatigue?: Yes  Skin Skin rash/lesions?: Yes Itching?: Yes  Eyes Blurred vision?: Yes Double vision?: Yes  Ears/Nose/Throat Sore throat?: No Sinus problems?: Yes  Hematologic/Lymphatic Swollen glands?: Yes Easy bruising?: Yes  Cardiovascular Leg swelling?: Yes Chest pain?: Yes  Respiratory Cough?: No Shortness of breath?: Yes  Endocrine Excessive thirst?: No  Musculoskeletal Back pain?: Yes Joint pain?: Yes  Neurological Headaches?: Yes Dizziness?: Yes  Psychologic Depression?: No Anxiety?: Yes  Physical Exam: BP 138/74   Pulse 75    Ht 4\' 11"  (1.499 m)   Wt 167 lb 8 oz (76 kg)   BMI 33.83 kg/m   Constitutional: Well nourished. Alert and oriented, No acute distress. HEENT: [place] AT, moist mucus membranes. Trachea midline, no masses. Cardiovascular: No clubbing, cyanosis, or edema. Respiratory: Normal respiratory effort, no increased work of breathing. GI: Abdomen is soft, non tender, non distended, no abdominal masses. Liver and spleen not palpable.  No hernias appreciated.  Stool sample for occult testing is not indicated.   GU: No CVA tenderness.  No bladder fullness or masses.  Normal external genitalia, normal pubic hair distribution, no lesions.  Normal urethral meatus, no lesions, no prolapse, no discharge.   No urethral masses, tenderness and/or tenderness. No bladder fullness, tenderness or masses. Normal vagina mucosa, good estrogen effect, no discharge, no lesions, good pelvic support, no cystocele or rectocele noted.  No cervical motion tenderness.  Uterus is freely mobile and non-fixed.  No adnexal/parametria masses or tenderness noted.  Anus and perineum are without rashes or lesions.    Skin: No rashes, bruises or suspicious lesions. Lymph: No cervical or inguinal adenopathy. Neurologic: Grossly intact, no focal deficits, moving all 4 extremities. Psychiatric: Normal mood and affect.  Laboratory Data: Urinalysis Unremarkable.  See Epic.   Pertinent Imaging: CLINICAL DATA:  Initial evaluation for acute dysuria. Left-sided abdominal pain.  EXAM: CT ABDOMEN AND PELVIS WITHOUT CONTRAST  TECHNIQUE: Multidetector CT imaging of the abdomen and pelvis was performed following the standard protocol without IV contrast.  COMPARISON:  Prior CT from 02/15/2015.  FINDINGS: Minimal subsegmental atelectasis seen dependently within the visualized lung bases. Visualized lungs are otherwise clear para  Limited noncontrast evaluation of the liver is unremarkable. Gallbladder contracted without acute  abnormality. No biliary dilatation. Spleen within normal limits. Adrenal glands and pancreas demonstrate a normal unenhanced appearance.  Kidneys are equal in size without evidence of nephrolithiasis or hydronephrosis. No radiopaque calculi seen along the course of either ureter. No hydroureter.  Stomach within normal limits. No evidence for bowel obstruction. No abnormal wall thickening or inflammatory fat stranding seen about the bowels. No evidence for acute appendicitis.  Bladder within normal limits.  Uterus and ovaries normal.  No free air or fluid. No pathologically enlarged intra-abdominal or pelvic lymph nodes. Retro aortic left renal vein noted.  No acute osseous abnormality. No worrisome lytic or blastic osseous lesions.  IMPRESSION: 1. No CT evidence for nephrolithiasis or obstructive uropathy. 2. No other acute intra-abdominal or pelvic process identified.   Electronically Signed   By: Jeannine Boga M.D.   On: 07/31/2015 02:58  I have independently reviewed the films  Assessment & Plan:    1. Gross hematuria  - I explained to the patient that there are a number of causes that can be associated with blood in the urine, such as stones, UTI's, damage to the urinary tract and/or cancer.  - At this time, I felt that the patient warranted further urologic evaluation.  The AUA guidelines state that a CT urogram is the preferred imaging study to evaluate hematuria.  - I explained to the patient that a contrast material will be injected into a vein and that in rare instances, an allergic reaction can result and may even life threatening   The patient denies any allergies to contrast, iodine and/or seafood and is not taking metformin.  - Her reproductive status is unknown at this time.  We will obtain a serum pregnancy test today.   - Following the imaging study,  I've recommended a cystoscopy. I described how this is performed, typically in an office setting  with a flexible cystoscope. We described the risks, benefits, and possible side effects, the most common of which is a minor amount of blood in the urine and/or burning which usually resolves in 24 to 48 hours.    - The patient had the opportunity to ask questions which were answered. Based upon this discussion, the patient is willing to proceed. Therefore, I've ordered: a CT Urogram and cystoscopy.  - The patient will return following all of the above for discussion of the results.   - UA  - Urine culture  - BUN + creatinine    - serum pregnancy test  2. Urge incontinence  - will address once her hematuria work up is completed and no worrisome findings are discovered   - Bladder Scan (Post Void Residual) in office  3. Vaginal atrophy   - explained to the patient the findings - will hold on prescribing vaginal estrogen cream at this time  Return for CT Urogram report and cystoscopy.  These notes generated with voice recognition software. I apologize for typographical errors.  Zara Council, PA-C  [facility] [address] [place], [place] [address] [phone]     Return for CT Urogram report and cystoscopy.  These notes generated with voice recognition software. I apologize for typographical errors.  Zara Council, [facility] [address] [place], [place] [address] [phone]

## 2017-01-25 ENCOUNTER — Ambulatory Visit (INDEPENDENT_AMBULATORY_CARE_PROVIDER_SITE_OTHER): Payer: 59 | Admitting: Urology

## 2017-01-25 ENCOUNTER — Other Ambulatory Visit
Admission: RE | Admit: 2017-01-25 | Discharge: 2017-01-25 | Disposition: A | Discharge status: Home / Self Care | Payer: 59 | Source: Ambulatory Visit | Attending: Urology | Admitting: Urology

## 2017-01-25 ENCOUNTER — Encounter: Payer: Self-pay | Admitting: Urology

## 2017-01-25 VITALS — BP 138/74 | HR 75 | Ht 59.0 in | Wt 167.5 lb

## 2017-01-25 DIAGNOSIS — R310 Gross hematuria: Secondary | ICD-10-CM | POA: Insufficient documentation

## 2017-01-25 DIAGNOSIS — N952 Postmenopausal atrophic vaginitis: Secondary | ICD-10-CM

## 2017-01-25 DIAGNOSIS — N3941 Urge incontinence: Secondary | ICD-10-CM

## 2017-01-25 DIAGNOSIS — R350 Frequency of micturition: Secondary | ICD-10-CM

## 2017-01-25 DIAGNOSIS — E785 Hyperlipidemia, unspecified: Secondary | ICD-10-CM | POA: Insufficient documentation

## 2017-01-25 LAB — URINALYSIS, COMPLETE (UACMP) WITH MICROSCOPIC
Bacteria, UA: NONE SEEN
Bilirubin Urine: NEGATIVE
Glucose, UA: NEGATIVE mg/dL
Hgb urine dipstick: NEGATIVE
Leukocytes, UA: NEGATIVE
Nitrite: NEGATIVE
PROTEIN: NEGATIVE mg/dL
RBC / HPF: NONE SEEN RBC/hpf (ref 0–5)
Specific Gravity, Urine: >1.03 — ABNORMAL HIGH (ref 1.005–1.030)
pH: 5.5 (ref 5.0–8.0)

## 2017-01-25 LAB — BUN: BUN: 21 mg/dL — ABNORMAL HIGH (ref 6–20)

## 2017-01-25 LAB — CREATININE, SERUM
CREATININE: 0.5 mg/dL (ref 0.44–1.00)
GFR calc Af Amer: >60 mL/min (ref >60)

## 2017-01-25 LAB — PREGNANCY, URINE: PREG TEST UR: NEGATIVE

## 2017-01-25 LAB — BLADDER SCAN AMB NON-IMAGING: Scan Result: 45

## 2017-01-25 NOTE — Patient Instructions (Addendum)
Hematuria, Adult Hematuria is blood in your urine. It can be caused by a bladder infection, kidney infection, prostate infection, kidney stone, or cancer of your urinary tract. Infections can usually be treated with medicine, and a kidney stone usually will pass through your urine. If neither of these is the cause of your hematuria, further workup to find out the reason may be needed. It is very important that you tell your health care provider about any blood you see in your urine, even if the blood stops without treatment or happens without causing pain. Blood in your urine that happens and then stops and then happens again can be a symptom of a very serious condition. Also, pain is not a symptom in the initial stages of many urinary cancers. Follow these instructions at home:  Drink lots of fluid, 3-4 quarts a day. If you have been diagnosed with an infection, cranberry juice is especially recommended, in addition to large amounts of water.  Avoid caffeine, tea, and carbonated beverages because they tend to irritate the bladder.  Avoid alcohol because it may irritate the prostate.  Take all medicines as directed by your health care provider.  If you were prescribed an antibiotic medicine, finish it all even if you start to feel better.  If you have been diagnosed with a kidney stone, follow your health care provider's instructions regarding straining your urine to catch the stone.  Empty your bladder often. Avoid holding urine for long periods of time.  After a bowel movement, women should cleanse front to back. Use each tissue only once.  Empty your bladder before and after sexual intercourse if you are a female. Contact a health care provider if:  You develop back pain.  You have a fever.  You have a feeling of sickness in your stomach (nausea) or vomiting.  Your symptoms are not better in 3 days. Return sooner if you are getting worse. Get help right away if:  You develop  severe vomiting and are unable to keep the medicine down.  You develop severe back or abdominal pain despite taking your medicines.  You begin passing a large amount of blood or clots in your urine.  You feel extremely weak or faint, or you pass out. This information is not intended to replace advice given to you by your health care provider. Make sure you discuss any questions you have with your health care provider. Document Released: 02/12/2005 Document Revised: 07/21/2015 Document Reviewed: 10/13/2012 Elsevier Interactive Patient Education  2017 Elsevier Inc.  CT Scan A computed tomography (CT) scan is a specialized X-ray scan. It uses X-rays and a computer to make pictures of different areas of your body. A CT scan can offer more detailed information than a regular X-ray exam. The CT scan provides data about internal organs, soft tissue structures, blood vessels, and bones. The CT scanner is a large machine that takes pictures of your body as you move through the opening. Tell a health care provider about:  Any allergies you have.  All medicines you are taking, including vitamins, herbs, eye drops, creams, and over-the-counter medicines.  Any problems you or family members have had with anesthetic medicines.  Any blood disorders you have.  Any surgeries you have had.  Any medical conditions you have. What are the risks? Generally, this is a safe procedure. However, as with any procedure, problems can occur. Possible problems include:  An allergic reaction to the contrast material.  Development of cancer from excessive exposure to   radiation. The risk of this is small.  What happens before the procedure?  The day before the test, stop drinking caffeinated beverages. These include energy drinks, tea, soda, coffee, and hot chocolate.  On the day of the test: ? About 4 hours before the test, stop eating and drinking anything but water as advised by your health care  provider. ? Avoid wearing jewelry. You will have to partly or fully undress and wear a hospital gown. What happens during the procedure?  You will be asked to lie on a table with your arms above your head.  If contrast dye is to be used for the test, an IV tube will be inserted in your arm. The contrast dye will be injected into the IV tube. You might feel warm, or you may get a metallic taste in your mouth.  The table you will be lying on will move into a large machine that will do the scanning.  You will be able to see, hear, and talk to the person running the machine while you are in it. Follow that person's directions.  The CT machine will move around you to take pictures. Do not move while it is scanning. This helps to get a good image.  When the best possible pictures have been taken, the machine will be turned off. The table will be moved out of the machine. The IV tube will then be removed. What happens after the procedure? Ask your health care provider when to follow up for your test results. This information is not intended to replace advice given to you by your health care provider. Make sure you discuss any questions you have with your health care provider. Document Released: 03/22/2004 Document Revised: 07/21/2015 Document Reviewed: 10/20/2012 Elsevier Interactive Patient Education  2017 Elsevier Inc.  Cystoscopy Cystoscopy is a procedure that is used to help diagnose and sometimes treat conditions that affect that lower urinary tract. The lower urinary tract includes the bladder and the tube that drains urine from the bladder out of the body (urethra). Cystoscopy is performed with a thin, tube-shaped instrument with a light and camera at the end (cystoscope). The cystoscope may be hard (rigid) or flexible, depending on the goal of the procedure.The cystoscope is inserted through the urethra, into the bladder. Cystoscopy may be recommended if you have:  Urinary tractinfections  that keep coming back (recurring).  Blood in the urine (hematuria).  Loss of bladder control (urinary incontinence) or an overactive bladder.  Unusual cells found in a urine sample.  A blockage in the urethra.  Painful urination.  An abnormality in the bladder found during an intravenous pyelogram (IVP) or CT scan.  Cystoscopy may also be done to remove a sample of tissue to be examined under a microscope (biopsy). Tell a health care provider about:  Any allergies you have.  All medicines you are taking, including vitamins, herbs, eye drops, creams, and over-the-counter medicines.  Any problems you or family members have had with anesthetic medicines.  Any blood disorders you have.  Any surgeries you have had.  Any medical conditions you have.  Whether you are pregnant or may be pregnant. What are the risks? Generally, this is a safe procedure. However, problems may occur, including:  Infection.  Bleeding.  Allergic reactions to medicines.  Damage to other structures or organs.  What happens before the procedure?  Ask your health care provider about: ? Changing or stopping your regular medicines. This is especially important if you are taking   diabetes medicines or blood thinners. ? Taking medicines such as aspirin and ibuprofen. These medicines can thin your blood. Do not take these medicines before your procedure if your health care provider instructs you not to.  Follow instructions from your health care provider about eating or drinking restrictions.  You may be given antibiotic medicine to help prevent infection.  You may have an exam or testing, such as X-rays of the bladder, urethra, or kidneys.  You may have urine tests to check for signs of infection.  Plan to have someone take you home after the procedure. What happens during the procedure?  To reduce your risk of infection,your health care team will wash or sanitize their hands.  You will be  given one or more of the following: ? A medicine to help you relax (sedative). ? A medicine to numb the area (local anesthetic).  The area around the opening of your urethra will be cleaned.  The cystoscope will be passed through your urethra into your bladder.  Germ-free (sterile)fluid will flow through the cystoscope to fill your bladder. The fluid will stretch your bladder so that your surgeon can clearly examine your bladder walls.  The cystoscope will be removed and your bladder will be emptied. The procedure may vary among health care providers and hospitals. What happens after the procedure?  You may have some soreness or pain in your abdomen and urethra. Medicines will be available to help you.  You may have some blood in your urine.  Do not drive for 24 hours if you received a sedative. This information is not intended to replace advice given to you by your health care provider. Make sure you discuss any questions you have with your health care provider. Document Released: 02/10/2000 Document Revised: 06/23/2015 Document Reviewed: 12/30/2014 Elsevier Interactive Patient Education  2017 Elsevier Inc.  

## 2017-01-26 LAB — URINE CULTURE
CULTURE: NO GROWTH
SPECIAL REQUESTS: NORMAL

## 2017-01-28 ENCOUNTER — Telehealth: Payer: Self-pay

## 2017-01-28 NOTE — Telephone Encounter (Signed)
LMOM- ucx negative 

## 2017-01-28 NOTE — Telephone Encounter (Signed)
-----   Message from Nori Riis, PA-C sent at 01/27/2017  3:46 PM EST ----- Please let Mrs. Wessman know that her urine culture was negative.

## 2017-02-04 ENCOUNTER — Ambulatory Visit
Admission: RE | Admit: 2017-02-04 | Discharge: 2017-02-04 | Disposition: A | Discharge status: Home / Self Care | Payer: 59 | Source: Ambulatory Visit | Attending: Urology | Admitting: Urology

## 2017-02-04 ENCOUNTER — Ambulatory Visit: Admission: RE | Admit: 2017-02-04 | Payer: 59 | Source: Ambulatory Visit

## 2017-02-04 DIAGNOSIS — R310 Gross hematuria: Secondary | ICD-10-CM

## 2017-02-04 MED ORDER — IOPAMIDOL (ISOVUE-300) INJECTION 61%
125.0000 mL | Freq: Once | INTRAVENOUS | Status: AC | PRN
  Administered 2017-02-04: 125 mL via INTRAVENOUS

## 2017-02-05 ENCOUNTER — Ambulatory Visit: Payer: 59

## 2017-02-12 ENCOUNTER — Encounter: Payer: Self-pay | Admitting: Urology

## 2017-02-12 ENCOUNTER — Ambulatory Visit (INDEPENDENT_AMBULATORY_CARE_PROVIDER_SITE_OTHER): Payer: 59 | Admitting: Urology

## 2017-02-12 VITALS — BP 127/74 | HR 78 | Ht 59.0 in | Wt 170.2 lb

## 2017-02-12 DIAGNOSIS — R310 Gross hematuria: Secondary | ICD-10-CM

## 2017-02-12 MED ORDER — CIPROFLOXACIN HCL 500 MG PO TABS
500.0000 mg | ORAL_TABLET | Freq: Once | ORAL | Status: AC
  Administered 2017-02-12: 500 mg via ORAL

## 2017-02-12 MED ORDER — LIDOCAINE HCL 2 % EX GEL
1.0000 "application " | Freq: Once | CUTANEOUS | Status: AC
  Administered 2017-02-12: 1 via URETHRAL

## 2017-02-12 NOTE — Progress Notes (Signed)
   02/12/17  CC:  Chief Complaint  Patient presents with  . Cysto    HPI: History of hematuria.  CT showed no significant abnormalities.  She has storage related voiding symptoms.  Blood pressure 127/74, pulse 78, height 4\' 11"  (1.499 m), weight 170 lb 3.2 oz (77.2 kg). NED. A&Ox3.   No respiratory distress   Abd soft, NT, ND Normal external genitalia with patent urethral meatus  Cystoscopy Procedure Note  Patient identification was confirmed, informed consent was obtained, and patient was prepped using Betadine solution.  Lidocaine jelly was administered per urethral meatus.    Preoperative abx where received prior to procedure.    Procedure: - Flexible cystoscope introduced, without any difficulty.   - Thorough search of the bladder revealed:    normal urethral meatus    normal urothelium    no stones    no ulcers     no tumors    no urethral polyps    no trabeculation  - Ureteral orifices were normal in position and appearance.  Post-Procedure: - Patient tolerated the procedure well  Assessment/ Plan: No significant abnormalities identified on CT urogram and cystoscopy.  Trial of Vesicare 5 mg daily for her voiding symptoms.   Abbie Sons, MD

## 2017-02-13 LAB — URINALYSIS, COMPLETE
BILIRUBIN UA: NEGATIVE
Glucose, UA: NEGATIVE
Ketones, UA: NEGATIVE
LEUKOCYTES UA: NEGATIVE
Nitrite, UA: NEGATIVE
PH UA: 8.5 — AB (ref 5.0–7.5)
Protein, UA: NEGATIVE
RBC UA: NEGATIVE
Specific Gravity, UA: 1.015 (ref 1.005–1.030)
UUROB: 0.2 mg/dL (ref 0.2–1.0)

## 2017-02-13 LAB — MICROSCOPIC EXAMINATION: RBC, UA: NONE SEEN /hpf (ref 0–?)

## 2017-02-14 ENCOUNTER — Encounter: Payer: Self-pay | Admitting: Urology

## 2017-02-14 MED ORDER — SOLIFENACIN SUCCINATE 5 MG PO TABS: 5.0000 mg | ORAL_TABLET | Freq: Every day | ORAL | 2 refills | Status: DC

## 2017-02-26 DIAGNOSIS — C50919 Malignant neoplasm of unspecified site of unspecified female breast: Secondary | ICD-10-CM

## 2017-02-26 HISTORY — DX: Malignant neoplasm of unspecified site of unspecified female breast: C50.919

## 2017-04-01 ENCOUNTER — Encounter: Payer: Self-pay | Admitting: *Deleted

## 2017-04-01 ENCOUNTER — Ambulatory Visit
Admission: EM | Admit: 2017-04-01 | Discharge: 2017-04-01 | Disposition: A | Discharge status: Home / Self Care | Payer: 59 | Attending: Family Medicine | Admitting: Family Medicine

## 2017-04-01 DIAGNOSIS — J111 Influenza due to unidentified influenza virus with other respiratory manifestations: Secondary | ICD-10-CM

## 2017-04-01 DIAGNOSIS — R509 Fever, unspecified: Secondary | ICD-10-CM | POA: Diagnosis not present

## 2017-04-01 DIAGNOSIS — R0981 Nasal congestion: Secondary | ICD-10-CM

## 2017-04-01 DIAGNOSIS — R5383 Other fatigue: Secondary | ICD-10-CM | POA: Diagnosis not present

## 2017-04-01 DIAGNOSIS — R05 Cough: Secondary | ICD-10-CM | POA: Diagnosis not present

## 2017-04-01 DIAGNOSIS — R69 Illness, unspecified: Secondary | ICD-10-CM

## 2017-04-01 LAB — RAPID STREP SCREEN (MED CTR MEBANE ONLY): Streptococcus, Group A Screen (Direct): NEGATIVE

## 2017-04-01 MED ORDER — BENZONATATE 100 MG PO CAPS: 100.0000 mg | ORAL_CAPSULE | Freq: Three times a day (TID) | ORAL | 0 refills | Status: DC | PRN

## 2017-04-01 MED ORDER — OSELTAMIVIR PHOSPHATE 75 MG PO CAPS: 75.0000 mg | ORAL_CAPSULE | Freq: Two times a day (BID) | ORAL | 0 refills | Status: DC

## 2017-04-01 NOTE — ED Provider Notes (Signed)
[facility]    CSN: [id number] Arrival date & time: 04/01/17  6629     History   Chief Complaint Chief Complaint  Patient presents with  . Cough  . Nausea  . Chills  . Fever    HPI Caroline Lamb is a 57 y.o. female.   The history is provided by the patient.  Cough  Associated symptoms: fever, myalgias and rhinorrhea   Associated symptoms: no wheezing   Fever  Associated symptoms: congestion, cough, myalgias and rhinorrhea   URI  Presenting symptoms: congestion, cough, fatigue, fever and rhinorrhea   Severity:  Moderate Onset quality:  Sudden Duration:  2 days Timing:  Constant Progression:  Worsening Chronicity:  New Relieved by:  Nothing Ineffective treatments:  OTC medications Associated symptoms: myalgias   Associated symptoms: no wheezing   Risk factors: recent travel and sick contacts   Risk factors: not elderly, no chronic cardiac disease, no chronic kidney disease, no chronic respiratory disease, no diabetes mellitus, no immunosuppression and no recent illness     Past Medical History:  Diagnosis Date  . Anxiety   . Arthritis   . Asthma   . Hyperlipidemia   . Hypothyroidism     Patient Active Problem List   Diagnosis Date Noted  . Hyperlipidemia   . Urinary frequency 01/04/2017    Past Surgical History:  Procedure Laterality Date  . cesarean     x 2  . COLONOSCOPY WITH PROPOFOL N/A 02/07/2015   Procedure: COLONOSCOPY WITH PROPOFOL;  Surgeon: Hulen Luster, MD;  Location: [facility] ENDOSCOPY;  Service: Gastroenterology;  Laterality: N/A;  . DIAGNOSTIC LAPAROSCOPY    . TONSILLECTOMY      OB History    No data available       Home Medications    Prior to Admission medications   Medication Sig Start Date End Date Taking? Authorizing Provider  Aspirin-Caffeine (BC FAST PAIN RELIEF) 845-65 MG PACK Take 1 Package by mouth daily.   Yes [provider]  levothyroxine (SYNTHROID, LEVOTHROID) 125 MCG tablet Take 125 mcg daily  before breakfast by mouth.   Yes [provider]  benzonatate (TESSALON) 100 MG capsule Take 1 capsule (100 mg total) by mouth 3 (three) times daily as needed. 04/01/17   Norval Gable, MD  oseltamivir (TAMIFLU) 75 MG capsule Take 1 capsule (75 mg total) by mouth 2 (two) times daily. 04/01/17   Norval Gable, MD  solifenacin (VESICARE) 5 MG tablet Take 1 tablet (5 mg total) by mouth daily. 02/14/17   Abbie Sons, MD    Family History Family History  Problem Relation Age of Onset  . Hematuria Father   . Prostate cancer Father   . Hematuria Mother   . Tuberculosis Paternal Grandfather   . Kidney cancer Neg Hx   . Kidney disease Neg Hx   . Sickle cell trait Neg Hx     Social History Social History   Tobacco Use  . Smoking status: Never Smoker  . Smokeless tobacco: Never Used  Substance Use Topics  . Alcohol use: No  . Drug use: No     Allergies   Patient has no known allergies.   Review of Systems Review of Systems  Constitutional: Positive for fatigue and fever.  HENT: Positive for congestion and rhinorrhea.   Respiratory: Positive for cough. Negative for wheezing.   Musculoskeletal: Positive for myalgias.     Physical Exam Triage Vital Signs ED Triage Vitals  Enc Vitals Group  BP 04/01/17 0949 133/79     Pulse Rate 04/01/17 0949 (!) 105     Resp 04/01/17 0949 16     Temp 04/01/17 0949 98.9 F (37.2 C)     Temp Source 04/01/17 0949 Oral     SpO2 04/01/17 0949 100 %     Weight 04/01/17 0951 170 lb (77.1 kg)     Height 04/01/17 0951 4\' 11"  (1.499 m)     Head Circumference --      Peak Flow --      Pain Score --      Pain Loc --      Pain Edu? --      Excl. in [place]? --    No data found.  Updated Vital Signs BP 133/79 (BP Location: Left Arm)   Pulse (!) 105   Temp 98.9 F (37.2 C) (Oral)   Resp 16   Ht 4\' 11"  (1.499 m)   Wt 170 lb (77.1 kg)   SpO2 100%   BMI 34.34 kg/m   Visual Acuity Right Eye Distance:   Left Eye Distance:     Bilateral Distance:    Right Eye Near:   Left Eye Near:    Bilateral Near:     Physical Exam  Constitutional: She appears well-developed and well-nourished. No distress.  HENT:  Head: Normocephalic and atraumatic.  Right Ear: Tympanic membrane, external ear and ear canal normal.  Left Ear: Tympanic membrane, external ear and ear canal normal.  Nose: No mucosal edema, rhinorrhea, nose lacerations, sinus tenderness, nasal deformity, septal deviation or nasal septal hematoma. No epistaxis.  No foreign bodies. Right sinus exhibits no maxillary sinus tenderness and no frontal sinus tenderness. Left sinus exhibits no maxillary sinus tenderness and no frontal sinus tenderness.  Mouth/Throat: Uvula is midline, oropharynx is clear and moist and mucous membranes are normal. No oropharyngeal exudate.  Eyes: Conjunctivae and EOM are normal. Pupils are equal, round, and reactive to light. Right eye exhibits no discharge. Left eye exhibits no discharge. No scleral icterus.  Neck: Normal range of motion. Neck supple. No thyromegaly present.  Cardiovascular: Normal rate, regular rhythm and normal heart sounds.  Pulmonary/Chest: Effort normal and breath sounds normal. No stridor. No respiratory distress. She has no wheezes. She has no rales.  Lymphadenopathy:    She has no cervical adenopathy.  Skin: She is not diaphoretic.  Nursing note and vitals reviewed.    UC Treatments / Results  Labs (all labs ordered are listed, but only abnormal results are displayed) Labs Reviewed  RAPID STREP SCREEN (NOT AT [facility])  CULTURE, GROUP A STREP [facility])    EKG  EKG Interpretation None       Radiology No results found.  Procedures Procedures (including critical care time)  Medications Ordered in UC Medications - No data to display   Initial Impression / Assessment and Plan / UC Course  I have reviewed the triage vital signs and the nursing notes.  Pertinent labs & imaging results that were  available during my care of the patient were reviewed by me and considered in my medical decision making (see chart for details).       Final Clinical Impressions(s) / UC Diagnoses   Final diagnoses:  Influenza-like illness    ED Discharge Orders        Ordered    oseltamivir (TAMIFLU) 75 MG capsule  2 times daily     04/01/17 1038    benzonatate (TESSALON) 100 MG capsule  3 times  daily PRN     04/01/17 1038     1. diagnosis reviewed with patient 2. rx as per orders above; reviewed possible side effects, interactions, risks and benefits  3. Recommend supportive treatment with fluids, rest, otc analgesics prn 4. Follow-up prn if symptoms worsen or don't improve  Controlled Substance Prescriptions [place] Controlled Substance Registry consulted? Not Applicable   Norval Gable, MD 04/01/17 1045

## 2017-04-01 NOTE — ED Triage Notes (Signed)
Non-productive cough, fever, chills, nausea, sore throat, x1 week.

## 2017-04-04 ENCOUNTER — Telehealth: Payer: Self-pay

## 2017-04-04 LAB — CULTURE, GROUP A STREP (THRC)

## 2017-04-04 NOTE — Telephone Encounter (Signed)
Called to follow up with patient since visit here at [facility]. Patient mailbox is full.  Patient will call back with any questions or concerns. [facility]

## 2017-05-23 NOTE — Progress Notes (Deleted)
05/24/2017 8:24 PM   Caroline Lamb 01/08/1961 [id number]  Referring provider: Katheren Shams [address] [place], [place] [address]  No chief complaint on file.   HPI: Patient is a 57 -year-old [place] female with a history of hematuria, urge incontinence and vaginal atrophy who presents for a one month follow up.  Background history Patient was referred by France Ravens, NP, for urinary frequency.  She stated she had been having right flank pain for 6 months.  It does not radiate.  She had been having gross hematuria.  She is experiencing frequency, nocturia, urge incontinence and intermittency.  She denied dysuria, suprapubic pain, back pain or abdominal pain.  She has not had any recent fevers, chills, nausea or vomiting.  She does not have a history of nephrolithiasis, GU surgery or GU trauma.  She is peri menopausal.  She admits to constipation.  She does engage in good perineal hygiene. She does not take tub baths.   She is not having pain with bladder filling.   She had a non contrast study in 2017 which noted kidneys are equal in size without evidence of nephrolithiasis or hydronephrosis. No radiopaque calculi seen along the course of either ureter. No hydroureter.  She is drinking 6 to 7 bottles of water daily.  No sodas.  One cup of coffee daily.  Cranberry juice daily.  Occasional alcohol.  She underwent a hematuria work up consisting of a CTU and cystoscopy.   Work up was negative.   She was started on Vesicare 5 mg daily.  The patient has been experiencing urgency x *** (***), frequency x *** (***), not/is restricting fluids to avoid visits to the restroom ***, not/is engaging in toilet mapping, incontinence x *** (***) and nocturia x *** (***).  Her PVR is ***.  Patient denies any gross hematuria, dysuria or suprapubic/flank pain.  Patient denies any fevers, chills, nausea or vomiting.   Patient was not started on vaginal estrogen cream.       PMH: Past Medical History:  Diagnosis Date  . Anxiety   . Arthritis   . Asthma   . Hyperlipidemia   . Hypothyroidism     Surgical History: Past Surgical History:  Procedure Laterality Date  . cesarean     x 2  . COLONOSCOPY WITH PROPOFOL N/A 02/07/2015   Procedure: COLONOSCOPY WITH PROPOFOL;  Surgeon: Hulen Luster, MD;  Location: [facility] ENDOSCOPY;  Service: Gastroenterology;  Laterality: N/A;  . DIAGNOSTIC LAPAROSCOPY    . TONSILLECTOMY      Home Medications:  Allergies as of 05/24/2017   No Known Allergies     Medication List        Accurate as of 05/23/17  8:24 PM. Always use your most recent med list.          BC FAST PAIN RELIEF 845-65 MG Pack Generic drug:  Aspirin-Caffeine Take 1 Package by mouth daily.   benzonatate 100 MG capsule Commonly known as:  TESSALON Take 1 capsule (100 mg total) by mouth 3 (three) times daily as needed.   levothyroxine 125 MCG tablet Commonly known as:  SYNTHROID, LEVOTHROID Take 125 mcg daily before breakfast by mouth.   oseltamivir 75 MG capsule Commonly known as:  TAMIFLU Take 1 capsule (75 mg total) by mouth 2 (two) times daily.   solifenacin 5 MG tablet Commonly known as:  VESICARE Take 1 tablet (5 mg total) by mouth daily.       Allergies: No Known  Allergies  Family History: Family History  Problem Relation Age of Onset  . Hematuria Father   . Prostate cancer Father   . Hematuria Mother   . Tuberculosis Paternal Grandfather   . Kidney cancer Neg Hx   . Kidney disease Neg Hx   . Sickle cell trait Neg Hx     Social History:  reports that she has never smoked. She has never used smokeless tobacco. She reports that she does not drink alcohol or use drugs.  ROS:                                        Physical Exam: There were no vitals taken for this visit.  Constitutional: Well nourished. Alert and oriented, No acute distress. HEENT: [place] AT, moist mucus membranes. Trachea midline,  no masses. Cardiovascular: No clubbing, cyanosis, or edema. Respiratory: Normal respiratory effort, no increased work of breathing. Skin: No rashes, bruises or suspicious lesions. Lymph: No cervical or inguinal adenopathy. Neurologic: Grossly intact, no focal deficits, moving all 4 extremities. Psychiatric: Normal mood and affect. ***  Laboratory Data: No recent labwork  Pertinent Imaging: ***  Assessment & Plan:    1. History of hematuria  - patient completed hematuria workup in 01/2017 - no worrisome findings  2. Urge incontinence  - ***  - Bladder Scan (Post Void Residual) in office  3. Vaginal atrophy   - prescribed vaginal estrogen cream to applying three nights weekly  No follow-ups on file.  These notes generated with voice recognition software. I apologize for typographical errors.  Zara Council, PA-C  [facility] [address] [place], [place] [address] [phone]     No follow-ups on file.  These notes generated with voice recognition software. I apologize for typographical errors.  Zara Council, [facility] [address] [place], [place] [address] [phone]

## 2017-05-24 ENCOUNTER — Ambulatory Visit: Payer: 59 | Admitting: Urology

## 2017-05-28 ENCOUNTER — Other Ambulatory Visit: Payer: Self-pay | Admitting: Nurse Practitioner

## 2017-05-28 DIAGNOSIS — N6320 Unspecified lump in the left breast, unspecified quadrant: Secondary | ICD-10-CM

## 2017-06-04 ENCOUNTER — Other Ambulatory Visit: Payer: Self-pay | Admitting: *Deleted

## 2017-06-04 ENCOUNTER — Inpatient Hospital Stay
Admission: RE | Admit: 2017-06-04 | Discharge: 2017-06-04 | Disposition: A | Discharge status: Home / Self Care | Payer: Self-pay | Source: Ambulatory Visit | Attending: *Deleted | Admitting: *Deleted

## 2017-06-04 DIAGNOSIS — Z9289 Personal history of other medical treatment: Secondary | ICD-10-CM

## 2017-06-07 ENCOUNTER — Other Ambulatory Visit: Payer: 59

## 2017-06-11 DIAGNOSIS — C50912 Malignant neoplasm of unspecified site of left female breast: Secondary | ICD-10-CM | POA: Insufficient documentation

## 2017-06-14 DIAGNOSIS — C50512 Malignant neoplasm of lower-outer quadrant of left female breast: Secondary | ICD-10-CM | POA: Insufficient documentation

## 2017-07-12 NOTE — Telephone Encounter (Signed)
This encounter was created in error - please disregard.

## 2017-07-24 ENCOUNTER — Other Ambulatory Visit: Payer: Self-pay

## 2017-07-24 ENCOUNTER — Emergency Department
Admission: EM | Admit: 2017-07-24 | Discharge: 2017-07-25 | Disposition: A | Discharge status: Home / Self Care | Payer: 59 | Attending: Emergency Medicine | Admitting: Emergency Medicine

## 2017-07-24 DIAGNOSIS — Z853 Personal history of malignant neoplasm of breast: Secondary | ICD-10-CM | POA: Insufficient documentation

## 2017-07-24 DIAGNOSIS — Z79899 Other long term (current) drug therapy: Secondary | ICD-10-CM | POA: Diagnosis not present

## 2017-07-24 DIAGNOSIS — E039 Hypothyroidism, unspecified: Secondary | ICD-10-CM | POA: Insufficient documentation

## 2017-07-24 DIAGNOSIS — M5126 Other intervertebral disc displacement, lumbar region: Secondary | ICD-10-CM

## 2017-07-24 DIAGNOSIS — J45909 Unspecified asthma, uncomplicated: Secondary | ICD-10-CM | POA: Insufficient documentation

## 2017-07-24 DIAGNOSIS — M549 Dorsalgia, unspecified: Secondary | ICD-10-CM | POA: Diagnosis present

## 2017-07-24 HISTORY — DX: Malignant (primary) neoplasm, unspecified: C80.1

## 2017-07-24 LAB — CBC
HCT: 37.8 % (ref 35.0–47.0)
Hemoglobin: 12.8 g/dL (ref 12.0–16.0)
MCH: 29.5 pg (ref 26.0–34.0)
MCHC: 33.9 g/dL (ref 32.0–36.0)
MCV: 87.1 fL (ref 80.0–100.0)
PLATELETS: 134 10*3/uL — AB (ref 150–440)
RBC: 4.34 MIL/uL (ref 3.80–5.20)
RDW: 12.9 % (ref 11.5–14.5)
WBC: 5.2 10*3/uL (ref 3.6–11.0)

## 2017-07-24 MED ORDER — MORPHINE SULFATE (PF) 2 MG/ML IV SOLN
INTRAVENOUS | Status: AC
  Filled 2017-07-24: qty 1

## 2017-07-24 MED ORDER — MORPHINE SULFATE (PF) 2 MG/ML IV SOLN
2.0000 mg | Freq: Once | INTRAVENOUS | Status: AC
  Administered 2017-07-24: 2 mg via INTRAVENOUS

## 2017-07-24 MED ORDER — MORPHINE SULFATE (PF) 2 MG/ML IV SOLN: 2.0000 mg | Freq: Once | INTRAVENOUS | Status: DC

## 2017-07-24 MED ORDER — SODIUM CHLORIDE 0.9 % IV BOLUS
1000.0000 mL | Freq: Once | INTRAVENOUS | Status: AC
  Administered 2017-07-24: 1000 mL via INTRAVENOUS

## 2017-07-24 MED ORDER — ONDANSETRON HCL 4 MG/2ML IJ SOLN
INTRAMUSCULAR | Status: AC
  Filled 2017-07-24: qty 2

## 2017-07-24 MED ORDER — ONDANSETRON HCL 4 MG/2ML IJ SOLN
4.0000 mg | Freq: Once | INTRAMUSCULAR | Status: AC
  Administered 2017-07-24: 4 mg via INTRAVENOUS

## 2017-07-24 MED ORDER — ONDANSETRON HCL 4 MG/2ML IJ SOLN: 4.0000 mg | Freq: Once | INTRAMUSCULAR | Status: DC

## 2017-07-24 NOTE — ED Provider Notes (Addendum)
[facility] Emergency Department Provider Note  ____________________________________________   None    (approximate)  I have reviewed the triage vital signs and the nursing notes.   HISTORY  Chief Complaint Back Pain   HPI Caroline Lamb is a 57 y.o. female below list of chronic medical conditions including breast cancer currently undergoing treatment including chemotherapy presents to the emergency department with acute onset of back pain extending from the thoracic spine to the lumbar spine that is currently 10 out of 10.  Patient stated that the pain began at 6:00 PM.  Patient denied any fever.  Patient denies any nausea vomiting or diarrhea.  Patient denies any abdominal discomfort.  She describes his pain as contractions/burning shooting pain.  This patient denies any lower extremity weakness.  Past Medical History:  Diagnosis Date  . Anxiety   . Arthritis   . Asthma   . breast   . Hyperlipidemia   . Hypothyroidism     Patient Active Problem List   Diagnosis Date Noted  . Hyperlipidemia   . Urinary frequency 01/04/2017    Past Surgical History:  Procedure Laterality Date  . cesarean     x 2  . COLONOSCOPY WITH PROPOFOL N/A 02/07/2015   Procedure: COLONOSCOPY WITH PROPOFOL;  Surgeon: Hulen Luster, MD;  Location: [facility] ENDOSCOPY;  Service: Gastroenterology;  Laterality: N/A;  . DIAGNOSTIC LAPAROSCOPY    . TONSILLECTOMY      Prior to Admission medications   Medication Sig Start Date End Date Taking? Authorizing Provider  Aspirin-Caffeine (BC FAST PAIN RELIEF) 845-65 MG PACK Take 1 Package by mouth daily.    [provider]  baclofen (LIORESAL) 10 MG tablet Take 1 tablet (10 mg total) by mouth 3 (three) times daily as needed for up to 10 days for muscle spasms. 07/25/17 08/04/17  Gregor Hams, MD  benzonatate (TESSALON) 100 MG capsule Take 1 capsule (100 mg total) by mouth 3 (three) times daily as needed. 04/01/17   Norval Gable, MD    levothyroxine (SYNTHROID, LEVOTHROID) 125 MCG tablet Take 125 mcg daily before breakfast by mouth.    [provider]  oseltamivir (TAMIFLU) 75 MG capsule Take 1 capsule (75 mg total) by mouth 2 (two) times daily. 04/01/17   Norval Gable, MD  solifenacin (VESICARE) 5 MG tablet Take 1 tablet (5 mg total) by mouth daily. 02/14/17   Stoioff, Ronda Fairly, MD    Allergies No known drug allergies  Family History  Problem Relation Age of Onset  . Hematuria Father   . Prostate cancer Father   . Hematuria Mother   . Tuberculosis Paternal Grandfather   . Kidney cancer Neg Hx   . Kidney disease Neg Hx   . Sickle cell trait Neg Hx     Social History Social History   Tobacco Use  . Smoking status: Never Smoker  . Smokeless tobacco: Never Used  Substance Use Topics  . Alcohol use: No  . Drug use: No    Review of Systems Constitutional: No fever/chills Eyes: No visual changes. ENT: No sore throat. Cardiovascular: Denies chest pain. Respiratory: Denies shortness of breath. Gastrointestinal: No abdominal pain.  No nausea, no vomiting.  No diarrhea.  No constipation. Genitourinary: Negative for dysuria. Musculoskeletal: Negative for neck pain.  Positive for diffuse back pain  integumentary: Negative for rash. Neurological: Negative for headaches, focal weakness or numbness.   ____________________________________________   PHYSICAL EXAM:  VITAL SIGNS: ED Triage Vitals [07/24/17 2200]  Enc  Vitals Group     BP 128/87     Pulse Rate 75     Resp 14     Temp (!) 97.1 F (36.2 C)     Temp Source Oral     SpO2 98 %     Weight 75.3 kg (166 lb)     Height 1.499 m (4\' 11" )     Head Circumference      Peak Flow      Pain Score 10     Pain Loc      Pain Edu?      Excl. in [place]?     Constitutional: Alert and oriented.  Apparent discomfort eyes: Conjunctivae are normal.  Head: Atraumatic. Mouth/Throat: Mucous membranes are moist.  Oropharynx non-erythematous. Neck: No  stridor.   Cardiovascular: Normal rate, regular rhythm. Good peripheral circulation. Grossly normal heart sounds. Respiratory: Normal respiratory effort.  No retractions. Lungs CTAB. Gastrointestinal: Soft and nontender. No distention.  Musculoskeletal: No lower extremity tenderness nor edema. No gross deformities of extremities. Neurologic:  Normal speech and language. No gross focal neurologic deficits are appreciated.  Skin:  Skin is warm, dry and intact. No rash noted. Psychiatric: Mood and affect are normal. Speech and behavior are normal.  ____________________________________________   LABS (all labs ordered are listed, but only abnormal results are displayed)  Labs Reviewed  CBC - Abnormal; Notable for the following components:      Result Value   Platelets 134 (*)    All other components within normal limits  COMPREHENSIVE METABOLIC PANEL - Abnormal; Notable for the following components:   Creatinine, Ser 0.41 (*)    All other components within normal limits  URINALYSIS, COMPLETE (UACMP) WITH MICROSCOPIC - Abnormal; Notable for the following components:   Color, Urine YELLOW (*)    APPearance CLEAR (*)    All other components within normal limits  LIPASE, BLOOD     RADIOLOGY I, [place] Siddiq Kaluzny, personally viewed and evaluated these images (plain radiographs) as part of my medical decision making, as well as reviewing the written report by the radiologist.  ED MD interpretation: CT thoracic and lumbar spine revealed multilevel degenerative disc disease.  MRI of the lumbar spine revealed mild disc herniation L2-S1  Official radiology report(s): Ct Thoracic Spine W Contrast  Result Date: 07/25/2017 CLINICAL DATA:  Back pain, breast cancer EXAM: CT THORACIC SPINE WITH CONTRAST TECHNIQUE: Multidetector CT images of thoracic was performed according to the standard protocol following intravenous contrast administration. CONTRAST:  120mL ISOVUE-300 IOPAMIDOL (ISOVUE-300)  INJECTION 61% COMPARISON:  None. FINDINGS: Alignment: Normal thoracic kyphosis. Vertebrae: Vertebral body heights and intervertebral disc spaces are maintained. No focal osseous lesions. Paraspinal and other soft tissues: Visualized lungs are motion degraded but clear. No suspicious mediastinal lymphadenopathy. Disc levels: Mild degenerative changes of the lower thoracic spine. Spinal canal is patent. IMPRESSION: Negative thoracic spine CT.  No focal osseous lesions. Electronically Signed   By: Julian Hy M.D.   On: 07/25/2017 01:09   Ct Lumbar Spine W Contrast  Result Date: 07/25/2017 CLINICAL DATA:  Back pain, breast cancer EXAM: CT LUMBAR SPINE WITH CONTRAST TECHNIQUE: Multidetector CT imaging of the lumbar spine was performed with intravenous contrast administration. CONTRAST:  11mL ISOVUE-300 IOPAMIDOL (ISOVUE-300) INJECTION 61% COMPARISON:  CT abdomen/pelvis dated 02/04/2017. FINDINGS: Segmentation: 5 lumbar-type vertebral bodies. Alignment: Normal lumbar lordosis. Vertebrae: Vertebral body heights and intervertebral disc spaces are maintained. No focal osseous lesions. Paraspinal and other soft tissues: Retroaortic left renal vein. No suspicious abdominopelvic  lymphadenopathy. Disc levels: Mild degenerative changes at L1-2, L2-3, and L3-4. Spinal canal is patent. IMPRESSION: Negative lumbar spine CT.  No focal osseous lesions. Electronically Signed   By: Julian Hy M.D.   On: 07/25/2017 01:12   Mr Lumbar Spine Wo Contrast  Result Date: 07/25/2017 CLINICAL DATA:  57 y/o F; back pain radiating to bilateral mid thighs. EXAM: MRI LUMBAR SPINE WITHOUT CONTRAST TECHNIQUE: Multiplanar, multisequence MR imaging of the lumbar spine was performed. No intravenous contrast was administered. COMPARISON:  07/25/2017 CT abdomen and pelvis. FINDINGS: Segmentation:  Standard. Alignment:  Physiologic. Vertebrae: No fracture, evidence of discitis, or bone lesion. Mild right-sided L4-5 facet edema, likely  degenerative. Conus medullaris and cauda equina: Conus extends to the L1 level. Fatty filum. Linear increased signal within the central conus medullaris, likely a ventriculus terminalis. S2 level Tarlov cysts. Paraspinal and other soft tissues: Negative. Disc levels: L1-2: No significant disc displacement, foraminal stenosis, or canal stenosis. L2-3: Mild disc bulge and facet hypertrophy. Small right subarticular annular fissure. No significant foraminal or canal stenosis. L3-4: Mild disc bulge and facet hypertrophy. No significant foraminal or canal stenosis. L4-5: Mild disc bulge and small central disc protrusion with mild facet hypertrophy. No significant foraminal or canal stenosis. L5-S1: Mild disc bulge and facet hypertrophy. No significant foraminal or canal stenosis. IMPRESSION: 1. Mild right L4-5 degenerative facet edema. 2. Fatty filum and ventriculus terminalis. 3. Mild multilevel disc and facet degenerative changes from L2-3 through L5-S1. No significant foraminal or canal stenosis. Electronically Signed   By: Kristine Garbe M.D.   On: 07/25/2017 03:13     Procedures   ____________________________________________   INITIAL IMPRESSION / ASSESSMENT AND PLAN / ED COURSE  As part of my medical decision making, I reviewed the following data within the electronic MEDICAL RECORD NUMBER   57 year old female presenting to the emergency department above-stated history and physical exam secondary to thoracic/lumbar spine pain concern for possible radiculopathy secondary to disc disease.  Also concern for possible pathologic fracture secondary to neoplasm.  Consider the possibility of neuropathy secondary to chemotherapeutic agents.  CT scan/MRI of the lumbar and thoracic spine revealed multilevel degenerative disc disease.  Patient given IV morphine and Zofran the emergency department improvement of pain.  Patient will be prescribed baclofen for home with recommendation to follow-up with oncology  today ____________________________________________  FINAL CLINICAL IMPRESSION(S) / ED DIAGNOSES  Final diagnoses:  Lumbar herniated disc     MEDICATIONS GIVEN DURING THIS VISIT:  Medications  lidocaine (LIDODERM) 5 % 4 patch (4 patches Transdermal Patch Applied 07/25/17 0420)  morphine 2 MG/ML injection 2 mg (2 mg Intravenous Given 07/24/17 2333)  ondansetron (ZOFRAN) injection 4 mg (4 mg Intravenous Given 07/24/17 2333)  sodium chloride 0.9 % bolus 1,000 mL (0 mLs Intravenous Stopped 07/25/17 0137)  iopamidol (ISOVUE-300) 61 % injection 100 mL (100 mLs Intravenous Contrast Given 07/25/17 0028)  morphine 4 MG/ML injection 4 mg (4 mg Intravenous Given 07/25/17 0119)  oxyCODONE-acetaminophen (PERCOCET/ROXICET) 5-325 MG per tablet 1 tablet (1 tablet Oral Given 07/25/17 0403)     ED Discharge Orders        Ordered    baclofen (LIORESAL) 10 MG tablet  3 times daily PRN     07/25/17 0413       Note:  This document was prepared using Dragon voice recognition software and may include unintentional dictation errors.    Gregor Hams, MD 07/25/17 1443    Gregor Hams, MD 07/25/17 [phone]

## 2017-07-24 NOTE — ED Triage Notes (Signed)
Pt comes via [facility] from home with c/o back pain. EMS states pt had 1st chem treatment last Wednesday. EMS reports BP-146/78, HR-100, 95% room air. Pt did take 1 viking about 30 minutes ago with no relief. Pt states 10/10 pain

## 2017-07-24 NOTE — ED Notes (Signed)
Report given to Rebecca RN.

## 2017-07-25 ENCOUNTER — Emergency Department: Payer: 59

## 2017-07-25 LAB — COMPREHENSIVE METABOLIC PANEL
ALBUMIN: 4 g/dL (ref 3.5–5.0)
ALT: 18 U/L (ref 14–54)
AST: 17 U/L (ref 15–41)
Alkaline Phosphatase: 68 U/L (ref 38–126)
Anion gap: 9 (ref 5–15)
BUN: 18 mg/dL (ref 6–20)
CHLORIDE: 103 mmol/L (ref 101–111)
CO2: 25 mmol/L (ref 22–32)
CREATININE: 0.41 mg/dL — AB (ref 0.44–1.00)
Calcium: 9 mg/dL (ref 8.9–10.3)
GFR calc Af Amer: >60 mL/min (ref >60)
GFR calc non Af Amer: >60 mL/min (ref >60)
GLUCOSE: 95 mg/dL (ref 65–99)
Potassium: 4.1 mmol/L (ref 3.5–5.1)
SODIUM: 137 mmol/L (ref 135–145)
Total Bilirubin: 0.5 mg/dL (ref 0.3–1.2)
Total Protein: 6.7 g/dL (ref 6.5–8.1)

## 2017-07-25 LAB — URINALYSIS, COMPLETE (UACMP) WITH MICROSCOPIC
BILIRUBIN URINE: NEGATIVE
Bacteria, UA: NONE SEEN
Glucose, UA: NEGATIVE mg/dL
Hgb urine dipstick: NEGATIVE
KETONES UR: NEGATIVE mg/dL
LEUKOCYTES UA: NEGATIVE
Nitrite: NEGATIVE
PROTEIN: NEGATIVE mg/dL
Specific Gravity, Urine: 1.02 (ref 1.005–1.030)
pH: 5 (ref 5.0–8.0)

## 2017-07-25 LAB — LIPASE, BLOOD: Lipase: 23 U/L (ref 11–51)

## 2017-07-25 MED ORDER — MORPHINE SULFATE (PF) 4 MG/ML IV SOLN
4.0000 mg | Freq: Once | INTRAVENOUS | Status: AC
  Administered 2017-07-25: 4 mg via INTRAVENOUS
  Filled 2017-07-25: qty 1

## 2017-07-25 MED ORDER — BACLOFEN 10 MG PO TABS: 10.0000 mg | ORAL_TABLET | Freq: Three times a day (TID) | ORAL | 1 refills | Status: DC | PRN

## 2017-07-25 MED ORDER — IOPAMIDOL (ISOVUE-300) INJECTION 61%
100.0000 mL | Freq: Once | INTRAVENOUS | Status: AC | PRN
  Administered 2017-07-25: 100 mL via INTRAVENOUS

## 2017-07-25 MED ORDER — OXYCODONE-ACETAMINOPHEN 5-325 MG PO TABS
1.0000 | ORAL_TABLET | Freq: Once | ORAL | Status: AC
  Administered 2017-07-25: 1 via ORAL
  Filled 2017-07-25: qty 1

## 2017-07-25 MED ORDER — LIDOCAINE 5 % EX PTCH
4.0000 | MEDICATED_PATCH | CUTANEOUS | Status: DC
  Administered 2017-07-25: 4 via TRANSDERMAL
  Filled 2017-07-25: qty 4

## 2017-07-25 NOTE — ED Notes (Signed)
Pt transported to MRI at this time 

## 2017-07-25 NOTE — ED Notes (Signed)
Pt returned from MRI and updated on wait time for results.

## 2017-07-25 NOTE — ED Notes (Signed)
Pt transported to CT at this time.

## 2017-09-25 ENCOUNTER — Other Ambulatory Visit: Payer: Self-pay

## 2017-09-25 ENCOUNTER — Emergency Department: Payer: 59

## 2017-09-25 ENCOUNTER — Inpatient Hospital Stay
Admission: EM | Admit: 2017-09-25 | Discharge: 2017-09-26 | DRG: 809 | Disposition: A | Discharge status: Home / Self Care | Payer: 59 | Attending: Specialist | Admitting: Specialist

## 2017-09-25 DIAGNOSIS — Z8042 Family history of malignant neoplasm of prostate: Secondary | ICD-10-CM

## 2017-09-25 DIAGNOSIS — D701 Agranulocytosis secondary to cancer chemotherapy: Secondary | ICD-10-CM | POA: Diagnosis present

## 2017-09-25 DIAGNOSIS — Z79890 Hormone replacement therapy (postmenopausal): Secondary | ICD-10-CM

## 2017-09-25 DIAGNOSIS — C50919 Malignant neoplasm of unspecified site of unspecified female breast: Secondary | ICD-10-CM | POA: Diagnosis present

## 2017-09-25 DIAGNOSIS — J45909 Unspecified asthma, uncomplicated: Secondary | ICD-10-CM | POA: Diagnosis present

## 2017-09-25 DIAGNOSIS — E039 Hypothyroidism, unspecified: Secondary | ICD-10-CM | POA: Diagnosis present

## 2017-09-25 DIAGNOSIS — D709 Neutropenia, unspecified: Secondary | ICD-10-CM | POA: Diagnosis present

## 2017-09-25 DIAGNOSIS — Z91041 Radiographic dye allergy status: Secondary | ICD-10-CM | POA: Diagnosis not present

## 2017-09-25 DIAGNOSIS — C779 Secondary and unspecified malignant neoplasm of lymph node, unspecified: Secondary | ICD-10-CM | POA: Diagnosis present

## 2017-09-25 DIAGNOSIS — R5081 Fever presenting with conditions classified elsewhere: Secondary | ICD-10-CM | POA: Diagnosis present

## 2017-09-25 DIAGNOSIS — R509 Fever, unspecified: Secondary | ICD-10-CM | POA: Diagnosis not present

## 2017-09-25 DIAGNOSIS — E785 Hyperlipidemia, unspecified: Secondary | ICD-10-CM | POA: Diagnosis present

## 2017-09-25 LAB — URINALYSIS, COMPLETE (UACMP) WITH MICROSCOPIC
BILIRUBIN URINE: NEGATIVE
Bacteria, UA: NONE SEEN
Glucose, UA: NEGATIVE mg/dL
Hgb urine dipstick: NEGATIVE
KETONES UR: NEGATIVE mg/dL
LEUKOCYTES UA: NEGATIVE
Nitrite: NEGATIVE
Protein, ur: NEGATIVE mg/dL
Specific Gravity, Urine: 1.016 (ref 1.005–1.030)
pH: 6 (ref 5.0–8.0)

## 2017-09-25 LAB — COMPREHENSIVE METABOLIC PANEL
ALT: 73 U/L — ABNORMAL HIGH (ref 0–44)
AST: 46 U/L — AB (ref 15–41)
Albumin: 4.2 g/dL (ref 3.5–5.0)
Alkaline Phosphatase: 66 U/L (ref 38–126)
Anion gap: 9 (ref 5–15)
BILIRUBIN TOTAL: 0.4 mg/dL (ref 0.3–1.2)
BUN: 14 mg/dL (ref 6–20)
CHLORIDE: 104 mmol/L (ref 98–111)
CO2: 26 mmol/L (ref 22–32)
Calcium: 9.4 mg/dL (ref 8.9–10.3)
Creatinine, Ser: 0.42 mg/dL — ABNORMAL LOW (ref 0.44–1.00)
GLUCOSE: 121 mg/dL — AB (ref 70–99)
POTASSIUM: 3.5 mmol/L (ref 3.5–5.1)
Sodium: 139 mmol/L (ref 135–145)
TOTAL PROTEIN: 6.9 g/dL (ref 6.5–8.1)

## 2017-09-25 LAB — CBC WITH DIFFERENTIAL/PLATELET
Basophils Absolute: 0 10*3/uL (ref 0–0.1)
Basophils Relative: 2 %
Eosinophils Absolute: 0 10*3/uL (ref 0–0.7)
Eosinophils Relative: 1 %
HEMATOCRIT: 33.2 % — AB (ref 35.0–47.0)
HEMOGLOBIN: 11.5 g/dL — AB (ref 12.0–16.0)
LYMPHS ABS: 1 10*3/uL (ref 1.0–3.6)
LYMPHS PCT: 42 %
MCH: 30.4 pg (ref 26.0–34.0)
MCHC: 34.7 g/dL (ref 32.0–36.0)
MCV: 87.8 fL (ref 80.0–100.0)
MONOS PCT: 34 %
Monocytes Absolute: 0.8 10*3/uL (ref 0.2–0.9)
NEUTROS PCT: 21 %
Neutro Abs: 0.5 10*3/uL — ABNORMAL LOW (ref 1.4–6.5)
Platelets: 256 10*3/uL (ref 150–440)
RBC: 3.79 MIL/uL — ABNORMAL LOW (ref 3.80–5.20)
RDW: 17.4 % — ABNORMAL HIGH (ref 11.5–14.5)
WBC: 2.3 10*3/uL — ABNORMAL LOW (ref 3.6–11.0)

## 2017-09-25 LAB — LACTIC ACID, PLASMA
Lactic Acid, Venous: 1.4 mmol/L (ref 0.5–1.9)
Lactic Acid, Venous: 0.9 mmol/L (ref 0.5–1.9)

## 2017-09-25 LAB — PROTIME-INR
INR: 0.92
Prothrombin Time: 12.3 seconds (ref 11.4–15.2)

## 2017-09-25 MED ORDER — VANCOMYCIN HCL IN DEXTROSE 1-5 GM/200ML-% IV SOLN
1000.0000 mg | Freq: Once | INTRAVENOUS | Status: DC
  Administered 2017-09-25: 1000 mg via INTRAVENOUS
  Filled 2017-09-25: qty 200

## 2017-09-25 MED ORDER — PROCHLORPERAZINE EDISYLATE 10 MG/2ML IJ SOLN
10.0000 mg | Freq: Once | INTRAMUSCULAR | Status: AC
  Administered 2017-09-25: 10 mg via INTRAVENOUS
  Filled 2017-09-25: qty 2

## 2017-09-25 MED ORDER — ACETAMINOPHEN 325 MG PO TABS: 650.0000 mg | ORAL_TABLET | Freq: Four times a day (QID) | ORAL | Status: DC | PRN

## 2017-09-25 MED ORDER — ACETAMINOPHEN 650 MG RE SUPP: 650.0000 mg | Freq: Four times a day (QID) | RECTAL | Status: DC | PRN

## 2017-09-25 MED ORDER — ONDANSETRON HCL 4 MG/2ML IJ SOLN: 4.0000 mg | Freq: Four times a day (QID) | INTRAMUSCULAR | Status: DC | PRN

## 2017-09-25 MED ORDER — ONDANSETRON HCL 4 MG PO TABS
4.0000 mg | ORAL_TABLET | Freq: Four times a day (QID) | ORAL | Status: DC | PRN
  Administered 2017-09-25: 4 mg via ORAL
  Filled 2017-09-25: qty 1

## 2017-09-25 MED ORDER — LORAZEPAM 0.5 MG PO TABS
0.5000 mg | ORAL_TABLET | Freq: Every evening | ORAL | Status: DC | PRN
  Administered 2017-09-25: 0.5 mg via ORAL
  Filled 2017-09-25: qty 1

## 2017-09-25 MED ORDER — ALBUTEROL SULFATE (2.5 MG/3ML) 0.083% IN NEBU: 2.5000 mg | INHALATION_SOLUTION | RESPIRATORY_TRACT | Status: DC | PRN

## 2017-09-25 MED ORDER — CEFEPIME HCL 2 G IJ SOLR
2.0000 g | Freq: Once | INTRAMUSCULAR | Status: AC
  Administered 2017-09-25: 2 g via INTRAVENOUS
  Filled 2017-09-25: qty 2

## 2017-09-25 MED ORDER — ENOXAPARIN SODIUM 40 MG/0.4ML ~~LOC~~ SOLN
40.0000 mg | SUBCUTANEOUS | Status: DC
  Administered 2017-09-25: 22:00:00 40 mg via SUBCUTANEOUS
  Filled 2017-09-25: qty 0.4

## 2017-09-25 MED ORDER — SODIUM CHLORIDE 0.9 % IV SOLN
2.0000 g | Freq: Two times a day (BID) | INTRAVENOUS | Status: DC
  Administered 2017-09-25 – 2017-09-26 (×2): 2 g via INTRAVENOUS
  Filled 2017-09-25 (×3): qty 2

## 2017-09-25 MED ORDER — LEVOTHYROXINE SODIUM 25 MCG PO TABS
125.0000 ug | ORAL_TABLET | Freq: Every day | ORAL | Status: DC
  Administered 2017-09-26: 125 ug via ORAL
  Filled 2017-09-25: qty 1

## 2017-09-25 NOTE — Progress Notes (Signed)
Advance care planning  Purpose of Encounter Neutropenic fever, breast cancer, CODE STATUS discussion  Patient is alert and oriented x3.  Able to make her own decisions.  Her husband is the [employer] power of attorney.  Discussed with patient regarding being admitted for neutropenic fever which is likely secondary to chemotherapy for breast cancer.  Patient tells me her breast cancer is spread to the lymph nodes.  She is waiting for repeat imaging studies with her oncologist to see her response to treatment.  Does not know anything about prognosis yet.  She is full code at this time.  Orders entered.  Time spent - 17 minutes

## 2017-09-25 NOTE — Progress Notes (Signed)
Pharmacy Antibiotic Note  Caroline Lamb is a 57 y.o. female admitted on 09/25/2017 with HCAP.  Pharmacy has been consulted for cefepime dosing.  Plan: Cefepime 2 gm IV Q12H. Patient received one dose of vancomycin in ED per EDP but admitting cancels vanc. Use Cefepime alone.   Height: 4\' 11"  (149.9 cm) Weight: 172 lb (78 kg) IBW/kg (Calculated) : 43.2  Temp (24hrs), Avg:98.2 F (36.8 C), Min:97.8 F (36.6 C), Max:98.5 F (36.9 C)  Recent Labs  Lab 09/25/17 1101 09/25/17 1305  WBC 2.3*  --   CREATININE 0.42*  --   LATICACIDVEN 1.4 0.9    Estimated Creatinine Clearance: 72.5 mL/min (A) (by C-G formula based on SCr of 0.42 mg/dL (L)).    Allergies  Allergen Reactions  . Contrast Media  [Iodinated Diagnostic Agents] Other (See Comments)    sneezing sneezing     Antimicrobials this admission:   Dose adjustments this admission:   Microbiology results:  BCx:   UCx:    Sputum:    MRSA PCR:   Thank you for allowing pharmacy to be a part of this patient's care.  Laural Benes, Pharm.D., BCPS Clinical Pharmacist 09/25/2017 2:33 PM

## 2017-09-25 NOTE — Progress Notes (Signed)
Patient admitted from the ED with neutropenic fever. Patient alert oriented  X 4, no c/o pain at this time and VS within normal range. Patient is independent in bed locked at lower level bedside table, call light and telephone within patient reach.

## 2017-09-25 NOTE — Plan of Care (Signed)
  Problem: Health Behavior/Discharge Planning: Goal: Ability to manage health-related needs will improve Outcome: Progressing   Problem: Clinical Measurements: Goal: Respiratory complications will improve Outcome: Progressing   Problem: Pain Managment: Goal: General experience of comfort will improve Outcome: Progressing   Problem: Skin Integrity: Goal: Risk for impaired skin integrity will decrease Outcome: Progressing

## 2017-09-25 NOTE — ED Provider Notes (Signed)
[facility] Emergency Department Provider Note    First MD Initiated Contact with Patient 09/25/17 1215     (approximate)  I have reviewed the triage vital signs and the nursing notes.   HISTORY  Chief Complaint Fever    HPI Caroline Lamb is a 56 y.o. female who is undergoing chemotherapy for breast cancer just receiving her second last chemotherapy 2 weeks ago at [facility] presents the ER for fevers to 101.4.  She is been having fevers daily for the past 3 days.  Has also had some GI upset with some diarrhea.  No blood in her stools.  Denies any cough or shortness of breath.  No neck stiffness.  No congestion.  No rash.  Denies any abdominal pain or dysuria.    Past Medical History:  Diagnosis Date  . Anxiety   . Arthritis   . Asthma   . breast   . Hyperlipidemia   . Hypothyroidism    Family History  Problem Relation Age of Onset  . Hematuria Father   . Prostate cancer Father   . Hematuria Mother   . Tuberculosis Paternal Grandfather   . Kidney cancer Neg Hx   . Kidney disease Neg Hx   . Sickle cell trait Neg Hx    Past Surgical History:  Procedure Laterality Date  . cesarean     x 2  . COLONOSCOPY WITH PROPOFOL N/A 02/07/2015   Procedure: COLONOSCOPY WITH PROPOFOL;  Surgeon: Hulen Luster, MD;  Location: [facility] ENDOSCOPY;  Service: Gastroenterology;  Laterality: N/A;  . DIAGNOSTIC LAPAROSCOPY    . TONSILLECTOMY     Patient Active Problem List   Diagnosis Date Noted  . Hyperlipidemia   . Urinary frequency 01/04/2017      Prior to Admission medications   Medication Sig Start Date End Date Taking? Authorizing Provider  Aspirin-Caffeine (BC FAST PAIN RELIEF) 845-65 MG PACK Take 1 Package by mouth daily.    [provider]  baclofen (LIORESAL) 10 MG tablet Take 1 tablet (10 mg total) by mouth 3 (three) times daily as needed for up to 10 days for muscle spasms. 07/25/17 08/04/17  Gregor Hams, MD  benzonatate (TESSALON) 100 MG  capsule Take 1 capsule (100 mg total) by mouth 3 (three) times daily as needed. 04/01/17   Norval Gable, MD  levothyroxine (SYNTHROID, LEVOTHROID) 125 MCG tablet Take 125 mcg daily before breakfast by mouth.    [provider]  oseltamivir (TAMIFLU) 75 MG capsule Take 1 capsule (75 mg total) by mouth 2 (two) times daily. 04/01/17   Norval Gable, MD  solifenacin (VESICARE) 5 MG tablet Take 1 tablet (5 mg total) by mouth daily. 02/14/17   Abbie Sons, MD    Allergies Patient has no known allergies.    Social History Social History   Tobacco Use  . Smoking status: Never Smoker  . Smokeless tobacco: Never Used  Substance Use Topics  . Alcohol use: No  . Drug use: No    Review of Systems Patient denies headaches, rhinorrhea, blurry vision, numbness, shortness of breath, chest pain, edema, cough, abdominal pain, nausea, vomiting, diarrhea, dysuria, fevers, rashes or hallucinations unless otherwise stated above in HPI. ____________________________________________   PHYSICAL EXAM:  VITAL SIGNS: Vitals:   09/25/17 1048 09/25/17 1235  BP: 133/66 (!) 141/81  Pulse: 95 81  Resp: 18 18  Temp: 97.8 F (36.6 C)   SpO2: 98% 100%    Constitutional: Alert and oriented.  Eyes:  Conjunctivae are normal.  Head: Atraumatic. Nose: No congestion/rhinnorhea. Mouth/Throat: Mucous membranes are moist.   Neck: No stridor. Painless ROM.  Cardiovascular: Normal rate, regular rhythm. Grossly normal heart sounds.  Good peripheral circulation. Respiratory: Normal respiratory effort.  No retractions. Lungs CTAB. Gastrointestinal: Soft and nontender. No distention. No abdominal bruits. No CVA tenderness. Genitourinary:  Musculoskeletal: No lower extremity tenderness nor edema.  No joint effusions. Neurologic:  Normal speech and language. No gross focal neurologic deficits are appreciated. No facial droop Skin:  Skin is warm, dry and intact. No rash noted. Psychiatric: Mood and affect  are normal. Speech and behavior are normal.  ____________________________________________   LABS (all labs ordered are listed, but only abnormal results are displayed)  Results for orders placed or performed during the hospital encounter of 09/25/17 (from the past 24 hour(s))  Comprehensive metabolic panel     Status: Abnormal   Collection Time: 09/25/17 11:01 AM  Result Value Ref Range   Sodium 139 135 - 145 mmol/L   Potassium 3.5 3.5 - 5.1 mmol/L   Chloride 104 98 - 111 mmol/L   CO2 26 22 - 32 mmol/L   Glucose, Bld 121 (H) 70 - 99 mg/dL   BUN 14 6 - 20 mg/dL   Creatinine, Ser 0.42 (L) 0.44 - 1.00 mg/dL   Calcium 9.4 8.9 - 10.3 mg/dL   Total Protein 6.9 6.5 - 8.1 g/dL   Albumin 4.2 3.5 - 5.0 g/dL   AST 46 (H) 15 - 41 U/L   ALT 73 (H) 0 - 44 U/L   Alkaline Phosphatase 66 38 - 126 U/L   Total Bilirubin 0.4 0.3 - 1.2 mg/dL   GFR calc non Af Amer >60 >60 mL/min   GFR calc Af Amer >60 >60 mL/min   Anion gap 9 5 - 15  Lactic acid, plasma     Status: None   Collection Time: 09/25/17 11:01 AM  Result Value Ref Range   Lactic Acid, Venous 1.4 0.5 - 1.9 mmol/L  CBC with Differential     Status: Abnormal   Collection Time: 09/25/17 11:01 AM  Result Value Ref Range   WBC 2.3 (L) 3.6 - 11.0 K/uL   RBC 3.79 (L) 3.80 - 5.20 MIL/uL   Hemoglobin 11.5 (L) 12.0 - 16.0 g/dL   HCT 33.2 (L) 35.0 - 47.0 %   MCV 87.8 80.0 - 100.0 fL   MCH 30.4 26.0 - 34.0 pg   MCHC 34.7 32.0 - 36.0 g/dL   RDW 17.4 (H) 11.5 - 14.5 %   Platelets 256 150 - 440 K/uL   Neutrophils Relative % 21 %   Neutro Abs 0.5 (L) 1.4 - 6.5 K/uL   Lymphocytes Relative 42 %   Lymphs Abs 1.0 1.0 - 3.6 K/uL   Monocytes Relative 34 %   Monocytes Absolute 0.8 0.2 - 0.9 K/uL   Eosinophils Relative 1 %   Eosinophils Absolute 0.0 0 - 0.7 K/uL   Basophils Relative 2 %   Basophils Absolute 0.0 0 - 0.1 K/uL  Protime-INR     Status: None   Collection Time: 09/25/17 11:01 AM  Result Value Ref Range   Prothrombin Time 12.3 11.4 -  15.2 seconds   INR 0.92   Urinalysis, Complete w Microscopic     Status: Abnormal   Collection Time: 09/25/17 11:07 AM  Result Value Ref Range   Color, Urine YELLOW (A) YELLOW   APPearance CLEAR (A) CLEAR   Specific Gravity, Urine 1.016 1.005 - 1.030  pH 6.0 5.0 - 8.0   Glucose, UA NEGATIVE NEGATIVE mg/dL   Hgb urine dipstick NEGATIVE NEGATIVE   Bilirubin Urine NEGATIVE NEGATIVE   Ketones, ur NEGATIVE NEGATIVE mg/dL   Protein, ur NEGATIVE NEGATIVE mg/dL   Nitrite NEGATIVE NEGATIVE   Leukocytes, UA NEGATIVE NEGATIVE   RBC / HPF 0-5 0 - 5 RBC/hpf   WBC, UA 0-5 0 - 5 WBC/hpf   Bacteria, UA NONE SEEN NONE SEEN   Squamous Epithelial / LPF 0-5 0 - 5   Mucus PRESENT    ____________________________________________ ____________________________________________  RADIOLOGY  I personally reviewed all radiographic images ordered to evaluate for the above acute complaints and reviewed radiology reports and findings.  These findings were personally discussed with the patient.  Please see medical record for radiology report.  ____________________________________________   PROCEDURES  Procedure(s) performed:  Procedures    Critical Care performed: no ____________________________________________   INITIAL IMPRESSION / ASSESSMENT AND PLAN / ED COURSE  Pertinent labs & imaging results that were available during my care of the patient were reviewed by me and considered in my medical decision making (see chart for details).   DDX: Sepsis, pneumonia, bacteremia, UTI, enteritis, colitis  OREL HORD is a 57 y.o. who presents to the ED with neutropenic fever.  Patient nontoxic-appearing she is afebrile now normal blood pressure no hypoxia.  Her abdominal exam is soft benign.  Patient is neutropenic with ANC less than 500.  Discussed case with Dr. Mike Gip and will provide initial dose of broad-spectrum antibiotics to cover for neutropenic fever.  Will obtain blood cultures.  Will discuss  with hospitalist for admission the hospital.      As part of my medical decision making, I reviewed the following data within the [facility] notes reviewed and incorporated, Labs reviewed, notes from prior ED visits.   ____________________________________________   FINAL CLINICAL IMPRESSION(S) / ED DIAGNOSES  Final diagnoses:  Neutropenia, unspecified type ([place])      NEW MEDICATIONS STARTED DURING THIS VISIT:  New Prescriptions   No medications on file     Note:  This document was prepared using Dragon voice recognition software and may include unintentional dictation errors.    Merlyn Lot, MD 09/25/17 1323

## 2017-09-25 NOTE — H&P (Signed)
[facility] [place] NAME: Caroline Lamb    MR#:  [id number]  DATE OF BIRTH:  1960/10/02  DATE OF ADMISSION:  09/25/2017  PRIMARY CARE PHYSICIAN: Katheren Shams   REQUESTING/REFERRING PHYSICIAN: Dr. Quentin Cornwall  CHIEF COMPLAINT:   Chief Complaint  Patient presents with  . Fever    HISTORY OF PRESENT ILLNESS:  Caroline Lamb  is a 57 y.o. female with a known history of breast cancer status post chemotherapy 1 week back, asthma presents to the hospital due to recurrent fevers at home.  Patient has had diarrhea with each round of chemotherapy which is similar at this time.  Has 3-4 watery stools daily.  No abdominal pain.  She has had fevers of up to 101 and 102.  Called her oncologist at [facility] and advised to come to the emergency room.  Here patient had a normal urinalysis.  Normal chest x-ray.  No signs of infection and is being admitted to the hospitalist service after being found to have neutropenia.  Started on IV vancomycin and cefepime.  Blood culture sent.  PAST MEDICAL HISTORY:   Past Medical History:  Diagnosis Date  . Anxiety   . Arthritis   . Asthma   . breast   . Hyperlipidemia   . Hypothyroidism     PAST SURGICAL HISTORY:   Past Surgical History:  Procedure Laterality Date  . cesarean     x 2  . COLONOSCOPY WITH PROPOFOL N/A 02/07/2015   Procedure: COLONOSCOPY WITH PROPOFOL;  Surgeon: Hulen Luster, MD;  Location: [facility] ENDOSCOPY;  Service: Gastroenterology;  Laterality: N/A;  . DIAGNOSTIC LAPAROSCOPY    . TONSILLECTOMY      SOCIAL HISTORY:   Social History   Tobacco Use  . Smoking status: Never Smoker  . Smokeless tobacco: Never Used  Substance Use Topics  . Alcohol use: No    FAMILY HISTORY:   Family History  Problem Relation Age of Onset  . Hematuria Father   . Prostate cancer Father   . Hematuria Mother   . Tuberculosis Paternal Grandfather   . Kidney cancer Neg Hx   . Kidney disease Neg Hx   .  Sickle cell trait Neg Hx     DRUG ALLERGIES:   Allergies  Allergen Reactions  . Contrast Media  [Iodinated Diagnostic Agents] Other (See Comments)    sneezing sneezing     REVIEW OF SYSTEMS:   Review of Systems  Constitutional: Positive for fever and malaise/fatigue. Negative for chills and weight loss.  HENT: Negative for hearing loss and nosebleeds.   Eyes: Negative for blurred vision, double vision and pain.  Respiratory: Negative for cough, hemoptysis, sputum production, shortness of breath and wheezing.   Cardiovascular: Negative for chest pain, palpitations, orthopnea and leg swelling.  Gastrointestinal: Positive for diarrhea. Negative for abdominal pain, constipation, nausea and vomiting.  Genitourinary: Negative for dysuria and hematuria.  Musculoskeletal: Negative for back pain, falls and myalgias.  Skin: Negative for rash.  Neurological: Negative for dizziness, tremors, sensory change, speech change, focal weakness, seizures and headaches.  Endo/Heme/Allergies: Does not bruise/bleed easily.  Psychiatric/Behavioral: Negative for depression and memory loss. The patient is not nervous/anxious.     MEDICATIONS AT HOME:   Prior to Admission medications   Medication Sig Start Date End Date Taking? Authorizing Provider  Aspirin-Caffeine (BC FAST PAIN RELIEF) 845-65 MG PACK Take 1 Package by mouth daily.    [provider]  baclofen (LIORESAL) 10 MG tablet Take  1 tablet (10 mg total) by mouth 3 (three) times daily as needed for up to 10 days for muscle spasms. 07/25/17 08/04/17  Gregor Hams, MD  benzonatate (TESSALON) 100 MG capsule Take 1 capsule (100 mg total) by mouth 3 (three) times daily as needed. 04/01/17   Norval Gable, MD  levothyroxine (SYNTHROID, LEVOTHROID) 125 MCG tablet Take 125 mcg daily before breakfast by mouth.    [provider]  oseltamivir (TAMIFLU) 75 MG capsule Take 1 capsule (75 mg total) by mouth 2 (two) times daily. 04/01/17    Norval Gable, MD  solifenacin (VESICARE) 5 MG tablet Take 1 tablet (5 mg total) by mouth daily. 02/14/17   Stoioff, Ronda Fairly, MD     VITAL SIGNS:  Blood pressure (!) 142/96, pulse 86, temperature 98.5 F (36.9 C), temperature source Oral, resp. rate 18, height 4\' 11"  (1.499 m), weight 78 kg (172 lb), SpO2 94 %.  PHYSICAL EXAMINATION:  Physical Exam  GENERAL:  57 y.o.-year-old patient lying in the bed with no acute distress.  EYES: Pupils equal, round, reactive to light and accommodation. No scleral icterus. Extraocular muscles intact.  HEENT: Head atraumatic, normocephalic. Oropharynx and nasopharynx clear. No oropharyngeal erythema, moist oral mucosa  NECK:  Supple, no jugular venous distention. No thyroid enlargement, no tenderness.  LUNGS: Normal breath sounds bilaterally, no wheezing, rales, rhonchi. No use of accessory muscles of respiration.  CARDIOVASCULAR: S1, S2 normal. No murmurs, rubs, or gallops.  ABDOMEN: Soft, nontender, nondistended. Bowel sounds present. No organomegaly or mass.  EXTREMITIES: No pedal edema, cyanosis, or clubbing. + 2 pedal & radial pulses b/l.   NEUROLOGIC: Cranial nerves II through XII are intact. No focal Motor or sensory deficits appreciated b/l PSYCHIATRIC: The patient is alert and oriented x 3. Good affect.  SKIN: No obvious rash, lesion, or ulcer.   LABORATORY PANEL:   CBC Recent Labs  Lab 09/25/17 1101  WBC 2.3*  HGB 11.5*  HCT 33.2*  PLT 256   ------------------------------------------------------------------------------------------------------------------  Chemistries  Recent Labs  Lab 09/25/17 1101  NA 139  K 3.5  CL 104  CO2 26  GLUCOSE 121*  BUN 14  CREATININE 0.42*  CALCIUM 9.4  AST 46*  ALT 73*  ALKPHOS 66  BILITOT 0.4   ------------------------------------------------------------------------------------------------------------------  Cardiac Enzymes No results for input(s): TROPONINI in the last 168  hours. ------------------------------------------------------------------------------------------------------------------  RADIOLOGY:  Dg Chest 2 View  Result Date: 09/25/2017 CLINICAL DATA:  One week of fever. History of breast malignancy with last chemotherapy 10 days ago. Nonsmoker. EXAM: CHEST - 2 VIEW COMPARISON:  CT scan of the chest of Jul 25, 2017 and chest x-ray of November 10, 2012 FINDINGS: The lungs are well-expanded. There is no focal infiltrate. There is no pleural effusion. The heart and pulmonary vascularity are normal. The mediastinum is normal in width. The trachea is midline. The porta catheter tip projects over the midportion of the SVC. The observed bony thorax is unremarkable. IMPRESSION: There is no acute cardiopulmonary abnormality. Electronically Signed   By: David  Martinique M.D.   On: 09/25/2017 11:23     IMPRESSION AND PLAN:   *Neutropenic fever.  Patient will be on neutropenic precautions.  Will discontinue vancomycin continue cefepime.  Await for blood cultures.  No clear etiology. She does have diarrhea but seems to be post chemotherapy.  No abdominal pain.  No blood.  *Breast cancer status post chemotherapy.  Patient is waiting for repeat imaging studies to see progress.  Follows at [facility]  oncology clinic.  *Asthma.  Patient thought she was wheezing earlier.  No wheezing at this time.  No shortness of breath.  Not needing oxygen.  Inhalers.  Nebulizers as needed.  DVT prophylaxis with Lovenox  All the records are reviewed and case discussed with ED provider. Management plans discussed with the patient, family and they are in agreement.  CODE STATUS: Full code  TOTAL TIME TAKING CARE OF THIS PATIENT: 35 minutes.   Leia Alf Summers Buendia M.D on 09/25/2017 at 1:50 PM  Between 7am to 6pm - Pager - [phone]  After 6pm go to [email or web address] - password [email or web address] [facility]  Office  [phone]  CC: Primary care physician;  Katheren Shams  Note: This dictation was prepared with Dragon dictation along with smaller phrase technology. Any transcriptional errors that result from this process are unintentional.

## 2017-09-25 NOTE — ED Notes (Signed)
Report called to receiving RN and pt updated with plan of care and transfer to floor.

## 2017-09-25 NOTE — ED Notes (Signed)
Patient transported to room 112

## 2017-09-25 NOTE — ED Triage Notes (Signed)
Pt is currently getting treated for breast CA with chemotherapy, states she had chemo 2 weeks ago and last week began having diarrhea which is a side affect, but states since last week she is having a fever 101 and left a my chart message with CA center they responded today to have the pt come to the ED for eval. Pt denies pain. States she has been hot and cold chills.

## 2017-09-26 LAB — CBC
HCT: 30.7 % — ABNORMAL LOW (ref 35.0–47.0)
Hemoglobin: 10.7 g/dL — ABNORMAL LOW (ref 12.0–16.0)
MCH: 30.5 pg (ref 26.0–34.0)
MCHC: 34.7 g/dL (ref 32.0–36.0)
MCV: 87.8 fL (ref 80.0–100.0)
PLATELETS: 241 10*3/uL (ref 150–440)
RBC: 3.5 MIL/uL — AB (ref 3.80–5.20)
RDW: 17.5 % — ABNORMAL HIGH (ref 11.5–14.5)
WBC: 2.2 10*3/uL — ABNORMAL LOW (ref 3.6–11.0)

## 2017-09-26 LAB — BASIC METABOLIC PANEL
Anion gap: 5 (ref 5–15)
BUN: 11 mg/dL (ref 6–20)
CALCIUM: 8.9 mg/dL (ref 8.9–10.3)
CO2: 27 mmol/L (ref 22–32)
CREATININE: 0.51 mg/dL (ref 0.44–1.00)
Chloride: 109 mmol/L (ref 98–111)
GFR calc non Af Amer: >60 mL/min (ref >60)
GLUCOSE: 108 mg/dL — AB (ref 70–99)
Potassium: 3.6 mmol/L (ref 3.5–5.1)
Sodium: 141 mmol/L (ref 135–145)

## 2017-09-26 MED ORDER — CEFDINIR 300 MG PO CAPS: 300.0000 mg | ORAL_CAPSULE | Freq: Two times a day (BID) | ORAL | 0 refills | Status: AC

## 2017-09-26 NOTE — Psychosocial Assessment (Signed)
Patient discharged home. Discharge education provided to patient, all questions and concerns answered. Patient verbalized understanding.

## 2017-09-26 NOTE — Discharge Summary (Signed)
Caroline Lamb at [place] NAME: Caroline Lamb    MR#:  [id number]  DATE OF BIRTH:  [birth year]  DATE OF ADMISSION:  09/25/2017 ADMITTING PHYSICIAN: Hillary Bow, MD  DATE OF DISCHARGE: 09/26/2017  PRIMARY CARE PHYSICIAN: [facility], Jef[facility] Bryant    ADMISSION DIAGNOSIS:  Neutropenia, unspecified type ([place]) [D70.9]  DISCHARGE DIAGNOSIS:  Active Problems:   Neutropenic fever ([place])   SECONDARY DIAGNOSIS:   Past Medical History:  Diagnosis Date  . Anxiety   . Arthritis   . Asthma   . breast   . Hyperlipidemia   . Hypothyroidism     HOSPITAL COURSE:   57 year old female with past medical history of breast cancer, hypothyroidism, who presented to the hospital due to fever.  1.  Neutropenic fevers-patient presented to the hospital with high fevers but while in the hospital she actually did not have any fevers.  She was empirically placed on cefepime.  Patient has no clinical symptoms other than just some diarrhea. -Patient's chest x-ray was negative, urinalysis negative, her cultures are negative.  She feels better.  She is being discharged home on empiric Cefdinir  2.  Hypothyroidism-patient will continue his Synthroid.  3.  Breast cancer with currently undergoing chemotherapy-patient will continue follow-up at [facility].  DISCHARGE CONDITIONS:   Stable.  CONSULTS OBTAINED:    DRUG ALLERGIES:   Allergies  Allergen Reactions  . Contrast Media  [Iodinated Diagnostic Agents] Other (See Comments)    sneezing sneezing     DISCHARGE MEDICATIONS:   Allergies as of 09/26/2017      Reactions   Contrast Media  [iodinated Diagnostic Agents] Other (See Comments)   sneezing sneezing      Medication List    STOP taking these medications   baclofen 10 MG tablet Commonly known as:  LIORESAL   benzonatate 100 MG capsule Commonly known as:  TESSALON   oseltamivir 75 MG capsule Commonly known as:  TAMIFLU   solifenacin 5 MG  tablet Commonly known as:  VESICARE     TAKE these medications   acetaminophen 500 MG tablet Commonly known as:  TYLENOL Take 500-1,000 mg by mouth every 6 (six) hours as needed for mild pain.   cefdinir 300 MG capsule Commonly known as:  OMNICEF Take 1 capsule (300 mg total) by mouth 2 (two) times daily for 7 days.   dexamethasone 4 MG tablet Commonly known as:  DECADRON Take 8 mg by mouth See admin instructions. Take 8 mg by mouth on days 2, 3 and 4 of chemo.   levothyroxine 125 MCG tablet Commonly known as:  SYNTHROID, LEVOTHROID Take 125 mcg daily before breakfast by mouth.   LORazepam 0.5 MG tablet Commonly known as:  ATIVAN Take 0.5 mg by mouth at bedtime as needed for sleep.   ondansetron 4 MG tablet Commonly known as:  ZOFRAN Take 8 mg by mouth every 8 (eight) hours as needed for nausea.   prochlorperazine 10 MG tablet Commonly known as:  COMPAZINE Take 10 mg by mouth every 6 (six) hours as needed for nausea.         DISCHARGE INSTRUCTIONS:   DIET:  Regular diet  DISCHARGE CONDITION:  Stable  ACTIVITY:  Activity as tolerated  OXYGEN:  Home Oxygen: No.   Oxygen Delivery: room air  DISCHARGE LOCATION:  home   If you experience worsening of your admission symptoms, develop shortness of breath, life threatening emergency, suicidal or homicidal thoughts you must seek medical attention immediately by calling  911 or calling your MD immediately  if symptoms less severe.  You Must read complete instructions/literature along with all the possible adverse reactions/side effects for all the Medicines you take and that have been prescribed to you. Take any new Medicines after you have completely understood and accpet all the possible adverse reactions/side effects.   Please note  You were cared for by a hospitalist during your hospital stay. If you have any questions about your discharge medications or the care you received while you were in the hospital  after you are discharged, you can call the unit and asked to speak with the hospitalist on call if the hospitalist that took care of you is not available. Once you are discharged, your primary care physician will handle any further medical issues. Please note that NO REFILLS for any discharge medications will be authorized once you are discharged, as it is imperative that you return to your primary care physician (or establish a relationship with a primary care physician if you do not have one) for your aftercare needs so that they can reassess your need for medications and monitor your lab values.     Today   Still having some loose stools, but no abdominal pain, nausea or vomiting.  Afebrile, hemodynamically stable.  Will discharge home today.  VITAL SIGNS:  Blood pressure 105/71, pulse 86, temperature 98.1 F (36.7 C), temperature source Oral, resp. rate 18, height 4\' 11"  (1.499 m), weight 79.5 kg (175 lb 4.3 oz), SpO2 100 %.  I/O:    Intake/Output Summary (Last 24 hours) at 09/26/2017 1229 Last data filed at 09/26/2017 1012 Gross per 24 hour  Intake 590 ml  Output 1 ml  Net 589 ml    PHYSICAL EXAMINATION:  GENERAL:  57 y.o.-year-old patient lying in the bed with no acute distress.  EYES: Pupils equal, round, reactive to light and accommodation. No scleral icterus. Extraocular muscles intact.  HEENT: Head atraumatic, normocephalic. Oropharynx and nasopharynx clear.  NECK:  Supple, no jugular venous distention. No thyroid enlargement, no tenderness.  LUNGS: Normal breath sounds bilaterally, no wheezing, rales,rhonchi. No use of accessory muscles of respiration.  CARDIOVASCULAR: S1, S2 normal. No murmurs, rubs, or gallops.  ABDOMEN: Soft, non-tender, non-distended. Bowel sounds present. No organomegaly or mass.  EXTREMITIES: No pedal edema, cyanosis, or clubbing.  NEUROLOGIC: Cranial nerves II through XII are intact. No focal motor or sensory defecits b/l.  PSYCHIATRIC: The patient is  alert and oriented x 3.  SKIN: No obvious rash, lesion, or ulcer.   DATA REVIEW:   CBC Recent Labs  Lab 09/26/17 0427  WBC 2.2*  HGB 10.7*  HCT 30.7*  PLT 241    Chemistries  Recent Labs  Lab 09/25/17 1101 09/26/17 0427  NA 139 141  K 3.5 3.6  CL 104 109  CO2 26 27  GLUCOSE 121* 108*  BUN 14 11  CREATININE 0.42* 0.51  CALCIUM 9.4 8.9  AST 46*  --   ALT 73*  --   ALKPHOS 66  --   BILITOT 0.4  --     Cardiac Enzymes No results for input(s): TROPONINI in the last 168 hours.  Microbiology Results  Results for orders placed or performed during the hospital encounter of 09/25/17  Culture, blood (Routine x 2)     Status: None (Preliminary result)   Collection Time: 09/25/17 11:06 AM  Result Value Ref Range Status   Specimen Description BLOOD LEFT ARM  Final   Special Requests   Final  BOTTLES DRAWN AEROBIC AND ANAEROBIC Blood Culture adequate volume   Culture   Final    NO GROWTH < 24 HOURS Performed at [facility], [address] [place], [place] [address]    Report Status PENDING  Incomplete    RADIOLOGY:  Dg Chest 2 View  Result Date: 09/25/2017 CLINICAL DATA:  One week of fever. History of breast malignancy with last chemotherapy 10 days ago. Nonsmoker. EXAM: CHEST - 2 VIEW COMPARISON:  CT scan of the chest of Jul 25, 2017 and chest x-ray of November 10, 2012 FINDINGS: The lungs are well-expanded. There is no focal infiltrate. There is no pleural effusion. The heart and pulmonary vascularity are normal. The mediastinum is normal in width. The trachea is midline. The porta catheter tip projects over the midportion of the SVC. The observed bony thorax is unremarkable. IMPRESSION: There is no acute cardiopulmonary abnormality. Electronically Signed   By: David  Martinique M.D.   On: 09/25/2017 11:23      Management plans discussed with the patient, family and they are in agreement.  CODE STATUS:     Code Status Orders  (From admission, onward)         Start     Ordered   09/25/17 1348  Full code  Continuous     09/25/17 1350    Code Status History    This patient has a current code status but no historical code status.      TOTAL TIME TAKING CARE OF THIS PATIENT: 40 minutes.    Henreitta Leber M.D on 09/26/2017 at 12:29 PM  Between 7am to 6pm - Pager - [phone]  After 6pm go to [email or web address] - [facility]  Office  [phone]  CC: Primary care physician; Katheren Shams

## 2017-09-26 NOTE — Plan of Care (Signed)

## 2017-09-27 LAB — HIV ANTIBODY (ROUTINE TESTING W REFLEX): HIV SCREEN 4TH GENERATION: NONREACTIVE

## 2017-09-30 LAB — CULTURE, BLOOD (ROUTINE X 2)
CULTURE: NO GROWTH
Special Requests: ADEQUATE

## 2017-10-02 ENCOUNTER — Telehealth: Payer: Self-pay

## 2017-10-02 NOTE — Telephone Encounter (Signed)
EMMI Follow-up: Received a call from Caroline Lamb as she had received a call from my number and was just checking to see if was about her lab work.  I explained she had received the 1st of 2 automated calls post discharge and a 2nd call would come in a day or two with a different series of questions.  She wondered if I could give her lab results and I directed her to call an make an appointment with her PCP or Acute Care clinic (said she had that phone number) as the physician normally provides those results. She hadn't made an appointment as she was going to [facility] for chemo treatments. She asked if the results would be posted in her "My Chart" and I said she should log in and check. No other needs noted for today.

## 2017-11-22 ENCOUNTER — Emergency Department
Admission: EM | Admit: 2017-11-22 | Discharge: 2017-11-22 | Disposition: A | Discharge status: Home / Self Care | Payer: 59 | Attending: Student in an Organized Health Care Education/Training Program | Admitting: Student in an Organized Health Care Education/Training Program

## 2017-11-22 ENCOUNTER — Encounter: Payer: Self-pay | Admitting: *Deleted

## 2017-11-22 ENCOUNTER — Other Ambulatory Visit: Payer: Self-pay

## 2017-11-22 DIAGNOSIS — T451X5A Adverse effect of antineoplastic and immunosuppressive drugs, initial encounter: Secondary | ICD-10-CM

## 2017-11-22 DIAGNOSIS — K921 Melena: Secondary | ICD-10-CM | POA: Diagnosis not present

## 2017-11-22 DIAGNOSIS — Z79899 Other long term (current) drug therapy: Secondary | ICD-10-CM | POA: Diagnosis not present

## 2017-11-22 DIAGNOSIS — E039 Hypothyroidism, unspecified: Secondary | ICD-10-CM | POA: Diagnosis not present

## 2017-11-22 DIAGNOSIS — J45909 Unspecified asthma, uncomplicated: Secondary | ICD-10-CM | POA: Diagnosis not present

## 2017-11-22 DIAGNOSIS — D701 Agranulocytosis secondary to cancer chemotherapy: Secondary | ICD-10-CM

## 2017-11-22 LAB — COMPREHENSIVE METABOLIC PANEL
ALBUMIN: 4.6 g/dL (ref 3.5–5.0)
ALK PHOS: 68 U/L (ref 38–126)
ALT: 63 U/L — ABNORMAL HIGH (ref 0–44)
AST: 35 U/L (ref 15–41)
Anion gap: 12 (ref 5–15)
BUN: 11 mg/dL (ref 6–20)
CHLORIDE: 102 mmol/L (ref 98–111)
CO2: 26 mmol/L (ref 22–32)
Calcium: 9.6 mg/dL (ref 8.9–10.3)
Creatinine, Ser: 0.49 mg/dL (ref 0.44–1.00)
GFR calc Af Amer: >60 mL/min (ref >60)
GFR calc non Af Amer: >60 mL/min (ref >60)
GLUCOSE: 134 mg/dL — AB (ref 70–99)
POTASSIUM: 3.9 mmol/L (ref 3.5–5.1)
Sodium: 140 mmol/L (ref 135–145)
Total Bilirubin: 0.7 mg/dL (ref 0.3–1.2)
Total Protein: 7.7 g/dL (ref 6.5–8.1)

## 2017-11-22 LAB — OCCULT BLOOD X 1 CARD TO LAB, STOOL: Fecal Occult Bld: NEGATIVE

## 2017-11-22 LAB — CBC
HEMATOCRIT: 37.2 % (ref 35.0–47.0)
HEMOGLOBIN: 12.7 g/dL (ref 12.0–16.0)
MCH: 28.5 pg (ref 26.0–34.0)
MCHC: 34 g/dL (ref 32.0–36.0)
MCV: 83.8 fL (ref 80.0–100.0)
Platelets: 246 10*3/uL (ref 150–440)
RBC: 4.44 MIL/uL (ref 3.80–5.20)
RDW: 15.7 % — ABNORMAL HIGH (ref 11.5–14.5)
WBC: 2 10*3/uL — ABNORMAL LOW (ref 3.6–11.0)

## 2017-11-22 LAB — TYPE AND SCREEN
ABO/RH(D): A POS
ANTIBODY SCREEN: NEGATIVE

## 2017-11-22 LAB — DIFFERENTIAL
BASOS ABS: 0 10*3/uL (ref 0–0.1)
Basophils Relative: 1 %
EOS PCT: 1 %
Eosinophils Absolute: 0 10*3/uL (ref 0–0.7)
LYMPHS ABS: 1 10*3/uL (ref 1.0–3.6)
Lymphocytes Relative: 51 %
MONOS PCT: 7 %
Monocytes Absolute: 0.1 10*3/uL — ABNORMAL LOW (ref 0.2–0.9)
Neutro Abs: 0.8 10*3/uL — ABNORMAL LOW (ref 1.4–6.5)
Neutrophils Relative %: 40 %

## 2017-11-22 MED ORDER — PROCHLORPERAZINE MALEATE 10 MG PO TABS
10.0000 mg | ORAL_TABLET | Freq: Once | ORAL | Status: AC
  Administered 2017-11-22: 10 mg via ORAL
  Filled 2017-11-22 (×2): qty 1

## 2017-11-22 MED ORDER — PANTOPRAZOLE SODIUM 40 MG PO TBEC: 40.0000 mg | DELAYED_RELEASE_TABLET | Freq: Two times a day (BID) | ORAL | 0 refills | Status: DC

## 2017-11-22 MED ORDER — PANTOPRAZOLE SODIUM 40 MG PO TBEC
40.0000 mg | DELAYED_RELEASE_TABLET | Freq: Every day | ORAL | Status: DC
  Administered 2017-11-22: 40 mg via ORAL
  Filled 2017-11-22: qty 1

## 2017-11-22 NOTE — ED Provider Notes (Signed)
[facility] Emergency Department Provider Note    First MD Initiated Contact with Patient 11/22/17 1334     (approximate)  I have reviewed the triage vital signs and the nursing notes.   HISTORY  Chief Complaint Melena    HPI Caroline Lamb is a 57 y.o. female on chemotherapy for breast cancer presents the ER with 3 days of melena.  Does have a history of reflux and gastritis.  Not take any NSAIDs is not on any blood thinners.  States that she feels that the black stools have resolved today.  Has had some small nosebleeds.  Denies any fevers or vomiting.  Has had some crampy abdominal pain which is not new for her.    Past Medical History:  Diagnosis Date  . Anxiety   . Arthritis   . Asthma   . breast   . Hyperlipidemia   . Hypothyroidism    Family History  Problem Relation Age of Onset  . Hematuria Father   . Prostate cancer Father   . Hematuria Mother   . Tuberculosis Paternal Grandfather   . Kidney cancer Neg Hx   . Kidney disease Neg Hx   . Sickle cell trait Neg Hx    Past Surgical History:  Procedure Laterality Date  . cesarean     x 2  . COLONOSCOPY WITH PROPOFOL N/A 02/07/2015   Procedure: COLONOSCOPY WITH PROPOFOL;  Surgeon: Hulen Luster, MD;  Location: [facility] ENDOSCOPY;  Service: Gastroenterology;  Laterality: N/A;  . DIAGNOSTIC LAPAROSCOPY    . TONSILLECTOMY     Patient Active Problem List   Diagnosis Date Noted  . Neutropenic fever ([place]) 09/25/2017  . Hyperlipidemia   . Urinary frequency 01/04/2017      Prior to Admission medications   Medication Sig Start Date End Date Taking? Authorizing Provider  acetaminophen (TYLENOL) 500 MG tablet Take 500-1,000 mg by mouth every 6 (six) hours as needed for mild pain.    [provider]  dexamethasone (DECADRON) 4 MG tablet Take 8 mg by mouth See admin instructions. Take 8 mg by mouth on days 2, 3 and 4 of chemo. 06/20/17   [provider]  levothyroxine (SYNTHROID,  LEVOTHROID) 125 MCG tablet Take 125 mcg daily before breakfast by mouth.    [provider]  LORazepam (ATIVAN) 0.5 MG tablet Take 0.5 mg by mouth at bedtime as needed for sleep. 06/20/17   [provider]  ondansetron (ZOFRAN) 4 MG tablet Take 8 mg by mouth every 8 (eight) hours as needed for nausea. 06/20/17   [provider]  prochlorperazine (COMPAZINE) 10 MG tablet Take 10 mg by mouth every 6 (six) hours as needed for nausea. 06/20/17   [provider]    Allergies Contrast media  [iodinated diagnostic agents]    Social History Social History   Tobacco Use  . Smoking status: Never Smoker  . Smokeless tobacco: Never Used  Substance Use Topics  . Alcohol use: No  . Drug use: No    Review of Systems Patient denies headaches, rhinorrhea, blurry vision, numbness, shortness of breath, chest pain, edema, cough, abdominal pain, nausea, vomiting, diarrhea, dysuria, fevers, rashes or hallucinations unless otherwise stated above in HPI. ____________________________________________   PHYSICAL EXAM:  VITAL SIGNS: Vitals:   11/22/17 1125  BP: 98/77  Pulse: (!) 106  Resp: 16  Temp: 97.8 F (36.6 C)  SpO2: 99%    Constitutional: Alert and oriented.  Eyes: Conjunctivae are normal.  Head:  Atraumatic. Nose: No congestion/rhinnorhea. Mouth/Throat: Mucous membranes are moist.   Neck: No stridor. Painless ROM.  Cardiovascular: Normal rate, regular rhythm. Grossly normal heart sounds.  Good peripheral circulation. Respiratory: Normal respiratory effort.  No retractions. Lungs CTAB. Gastrointestinal: Soft and nontender. No distention. No abdominal bruits. No CVA tenderness. Genitourinary:  Musculoskeletal: No lower extremity tenderness nor edema.  No joint effusions. Neurologic:  Normal speech and language. No gross focal neurologic deficits are appreciated. No facial droop Skin:  Skin is warm, dry and intact. No rash noted. Psychiatric: Mood and  affect are normal. Speech and behavior are normal.  ____________________________________________   LABS (all labs ordered are listed, but only abnormal results are displayed)  Results for orders placed or performed during the hospital encounter of 11/22/17 (from the past 24 hour(s))  Comprehensive metabolic panel     Status: Abnormal   Collection Time: 11/22/17 11:31 AM  Result Value Ref Range   Sodium 140 135 - 145 mmol/L   Potassium 3.9 3.5 - 5.1 mmol/L   Chloride 102 98 - 111 mmol/L   CO2 26 22 - 32 mmol/L   Glucose, Bld 134 (H) 70 - 99 mg/dL   BUN 11 6 - 20 mg/dL   Creatinine, Ser 0.49 0.44 - 1.00 mg/dL   Calcium 9.6 8.9 - 10.3 mg/dL   Total Protein 7.7 6.5 - 8.1 g/dL   Albumin 4.6 3.5 - 5.0 g/dL   AST 35 15 - 41 U/L   ALT 63 (H) 0 - 44 U/L   Alkaline Phosphatase 68 38 - 126 U/L   Total Bilirubin 0.7 0.3 - 1.2 mg/dL   GFR calc non Af Amer >60 >60 mL/min   GFR calc Af Amer >60 >60 mL/min   Anion gap 12 5 - 15  CBC     Status: Abnormal   Collection Time: 11/22/17 11:31 AM  Result Value Ref Range   WBC 2.0 (L) 3.6 - 11.0 K/uL   RBC 4.44 3.80 - 5.20 MIL/uL   Hemoglobin 12.7 12.0 - 16.0 g/dL   HCT 37.2 35.0 - 47.0 %   MCV 83.8 80.0 - 100.0 fL   MCH 28.5 26.0 - 34.0 pg   MCHC 34.0 32.0 - 36.0 g/dL   RDW 15.7 (H) 11.5 - 14.5 %   Platelets 246 150 - 440 K/uL  Type and screen [place]     Status: None   Collection Time: 11/22/17 11:34 AM  Result Value Ref Range   ABO/RH(D) A POS    Antibody Screen NEG    Sample Expiration      11/25/2017 Performed at [facility], [address] [place], [place] [address]   Occult blood card to lab, stool     Status: None   Collection Time: 11/22/17 11:35 AM  Result Value Ref Range   Fecal Occult Bld NEGATIVE NEGATIVE   _________________________________________________________________________________  RADIOLOGY  ____________________________________________   PROCEDURES  Procedure(s)  performed:  Procedures    Critical Care performed: no ____________________________________________   INITIAL IMPRESSION / ASSESSMENT AND PLAN / ED COURSE  Pertinent labs & imaging results that were available during my care of the patient were reviewed by me and considered in my medical decision making (see chart for details).   DDX: melena, epistaxis, anemia, esophagitis  Caroline Lamb is a 57 y.o. who presents to the ED with symptoms as described above.  Patient afebrile [place] dynamically stable.  It seems that the melena has resolved she is reporting green  stools today.  Does report some nosebleeds which be may be contributing to the presentation but patient also does have a history of reflux.  Her hemoglobin today is stable at 12.7.  Her BUN is normal therefore have a lower suspicion for large upper GI bleed.  No hematochezia.  Clinical Course as of Nov 23 1450  Fri Nov 22, 2017  1451 Occult blood card lab was negative.  Patient not having any melena at this point.  Hemoglobin stable.  Does not seem clinically consistent with acute upper GI bleed.  This point do believe she stable and appropriate for outpatient follow-up.  We discussed with hematology to ensure that she can have close follow-up for repeat blood work.  Discussed signs and symptoms for which she should return to the ER.   [PR]    Clinical Course User Index [PR] Merlyn Lot, MD     As part of my medical decision making, I reviewed the following data within the [place] notes reviewed and incorporated, Labs reviewed, notes from prior ED visits.   ____________________________________________   FINAL CLINICAL IMPRESSION(S) / ED DIAGNOSES  Final diagnoses:  Melena      NEW MEDICATIONS STARTED DURING THIS VISIT:  New Prescriptions   No medications on file     Note:  This document was prepared using Dragon voice recognition software and may include unintentional dictation  errors.    Merlyn Lot, MD 11/22/17 [phone]

## 2017-11-22 NOTE — ED Triage Notes (Signed)
Pt to ED reporting black stools x 3 days. Pt is currently on chemo and reports diarrhea is normal for her but the dark stools are new. Stabbing pain in lower left abd.

## 2018-04-15 DIAGNOSIS — E039 Hypothyroidism, unspecified: Secondary | ICD-10-CM | POA: Insufficient documentation

## 2018-10-17 DIAGNOSIS — R7989 Other specified abnormal findings of blood chemistry: Secondary | ICD-10-CM | POA: Insufficient documentation

## 2019-03-05 ENCOUNTER — Other Ambulatory Visit: Payer: Self-pay | Admitting: Nurse Practitioner

## 2019-03-05 ENCOUNTER — Other Ambulatory Visit (HOSPITAL_COMMUNITY): Payer: Self-pay | Admitting: Nurse Practitioner

## 2019-03-05 DIAGNOSIS — R102 Pelvic and perineal pain: Secondary | ICD-10-CM

## 2019-03-11 ENCOUNTER — Other Ambulatory Visit: Payer: Self-pay

## 2019-03-11 ENCOUNTER — Ambulatory Visit
Admission: RE | Admit: 2019-03-11 | Discharge: 2019-03-11 | Disposition: A | Discharge status: Home / Self Care | Payer: BC Managed Care – PPO | Source: Ambulatory Visit | Attending: Nurse Practitioner | Admitting: Nurse Practitioner

## 2019-03-11 DIAGNOSIS — R102 Pelvic and perineal pain: Secondary | ICD-10-CM | POA: Insufficient documentation

## 2019-04-24 ENCOUNTER — Other Ambulatory Visit: Payer: Self-pay | Admitting: Obstetrics and Gynecology

## 2019-04-24 DIAGNOSIS — R102 Pelvic and perineal pain: Secondary | ICD-10-CM

## 2019-04-24 DIAGNOSIS — R1032 Left lower quadrant pain: Secondary | ICD-10-CM

## 2019-05-05 ENCOUNTER — Ambulatory Visit: Payer: BC Managed Care – PPO

## 2019-12-08 ENCOUNTER — Other Ambulatory Visit: Payer: Self-pay | Admitting: Podiatry

## 2019-12-09 ENCOUNTER — Encounter
Admission: RE | Admit: 2019-12-09 | Discharge: 2019-12-09 | Disposition: A | Discharge status: Home / Self Care | Payer: BC Managed Care – PPO | Source: Ambulatory Visit | Attending: Podiatry | Admitting: Podiatry

## 2019-12-09 ENCOUNTER — Other Ambulatory Visit: Payer: Self-pay

## 2019-12-09 DIAGNOSIS — Z01812 Encounter for preprocedural laboratory examination: Secondary | ICD-10-CM | POA: Diagnosis not present

## 2019-12-09 HISTORY — DX: Essential (primary) hypertension: I10

## 2019-12-09 NOTE — Patient Instructions (Addendum)
Your procedure is scheduled on: 12/11/19 Report to Day Surgery on the 2nd floor of the [facility]. To find out your arrival time, please call [phone] between 1PM - 3PM on: 12/10/19  REMEMBER: Instructions that are not followed completely may result in serious medical risk, up to and including death; or upon the discretion of your surgeon and anesthesiologist your surgery may need to be rescheduled.  Do not eat food after midnight the night before surgery.  No gum chewing, lozengers or hard candies.  You may however, drink CLEAR liquids up to 2 hours before you are scheduled to arrive for your surgery. Do not drink anything within 2 hours of your scheduled arrival time.  Clear liquids include: - water  - apple juice without pulp - gatorade (not RED) - black coffee or tea (Do NOT add milk or creamers to the coffee or tea) Do NOT drink anything that is not on this list.  In addition, your doctor has ordered for you to drink the provided  Ensure Pre-Surgery Clear Carbohydrate Drink  Gatorade G2 Drinking this carbohydrate drink up to two hours before surgery helps to reduce insulin resistance and improve patient outcomes. Please complete drinking 2 hours prior to scheduled arrival time.  TAKE THESE MEDICATIONS THE MORNING OF SURGERY WITH A SIP OF WATER: 1. levothyroxine (SYNTHROID) 100 MCG tablet 2. liothyronine (CYTOMEL) 5 MCG tablet    One week prior to surgery: Stop Anti-inflammatories (NSAIDS) such as Advil, Aleve, Ibuprofen, Motrin, Naproxen, Naprosyn and Aspirin based products such as Excedrin, Goodys Powder, BC Powder.  Stop ANY OVER THE COUNTER supplements until after surgery. (You may continue taking Tylenol, Vitamin D, Vitamin B, and multivitamin.)  No Alcohol for 24 hours before or after surgery.  No Smoking including e-cigarettes for 24 hours prior to surgery.  No chewable tobacco products for at least 6 hours prior to surgery.  No nicotine patches on the day  of surgery.  Do not use any "recreational" drugs for at least a week prior to your surgery.  Please be advised that the combination of cocaine and anesthesia may have negative outcomes, up to and including death. If you test positive for cocaine, your surgery will be cancelled.  On the morning of surgery brush your teeth with toothpaste and water, you may rinse your mouth with mouthwash if you wish. Do not swallow any toothpaste or mouthwash.  Do not wear jewelry, make-up, hairpins, clips or nail polish.  Do not wear lotions, powders, or perfumes.   Do not shave 48 hours prior to surgery.   Contact lenses, hearing aids and dentures may not be worn into surgery.  Do not bring valuables to the hospital. [facility] is not responsible for any missing/lost belongings or valuables.   Use CHG Soap or wipes as directed on instruction sheet.  Notify your doctor if there is any change in your medical condition (cold, fever, infection).  Wear comfortable clothing (specific to your surgery type) to the hospital.  Plan for stool softeners for home use; pain medications have a tendency to cause constipation. You can also help prevent constipation by eating foods high in fiber such as fruits and vegetables and drinking plenty of fluids as your diet allows.  After surgery, you can help prevent lung complications by doing breathing exercises.  Take deep breaths and cough every 1-2 hours. Your doctor may order a device called an Incentive Spirometer to help you take deep breaths. When coughing or sneezing, hold a pillow firmly  against your incision with both hands. This is called "splinting." Doing this helps protect your incision. It also decreases belly discomfort.  If you are being admitted to the hospital overnight, leave your suitcase in the car. After surgery it may be brought to your room.  If you are being discharged the day of surgery, you will not be allowed to drive home. You will need  a responsible adult (18 years or older) to drive you home and stay with you that night.   If you are taking public transportation, you will need to have a responsible adult (18 years or older) with you. Please confirm with your physician that it is acceptable to use public transportation.   Please call the [place] [facility]. at [phone] if you have any questions about these instructions.  Visitation Policy:  Patients undergoing a surgery or procedure may have one family member or support person with them as long as that person is not COVID-19 positive or experiencing its symptoms.  That person may remain in the waiting area during the procedure.  Inpatient Visitation Update:   In an effort to ensure the safety of our team members and our patients, we are implementing a change to our visitation policy:  Effective Monday, Aug. 9, at 7 a.m., inpatients will be allowed one support person.  o The support person may change daily.  o The support person must pass our screening, gel in and out, and wear a mask at all times, including in the patient's room.  o Patients must also wear a mask when staff or their support person are in the room.  o Masking is required regardless of vaccination status.  Systemwide, no visitors 17 or younger.

## 2019-12-10 ENCOUNTER — Other Ambulatory Visit
Admission: RE | Admit: 2019-12-10 | Discharge: 2019-12-10 | Disposition: A | Discharge status: Home / Self Care | Payer: BC Managed Care – PPO | Source: Ambulatory Visit | Attending: Podiatry | Admitting: Podiatry

## 2019-12-10 DIAGNOSIS — Z20822 Contact with and (suspected) exposure to covid-19: Secondary | ICD-10-CM | POA: Diagnosis not present

## 2019-12-10 DIAGNOSIS — Z01812 Encounter for preprocedural laboratory examination: Secondary | ICD-10-CM | POA: Insufficient documentation

## 2019-12-10 LAB — SARS CORONAVIRUS 2 (TAT 6-24 HRS): SARS Coronavirus 2: NEGATIVE

## 2019-12-11 ENCOUNTER — Ambulatory Visit
Admission: RE | Admit: 2019-12-11 | Discharge: 2019-12-11 | Disposition: A | Discharge status: Home / Self Care | Payer: BC Managed Care – PPO | Attending: Podiatry | Admitting: Podiatry

## 2019-12-11 ENCOUNTER — Ambulatory Visit: Payer: BC Managed Care – PPO | Admitting: Anesthesiology

## 2019-12-11 ENCOUNTER — Encounter
Admission: RE | Disposition: A | Discharge status: Home / Self Care | Payer: Self-pay | Source: Home / Self Care | Attending: Podiatry

## 2019-12-11 ENCOUNTER — Encounter: Payer: Self-pay | Admitting: Podiatry

## 2019-12-11 ENCOUNTER — Other Ambulatory Visit: Payer: Self-pay

## 2019-12-11 DIAGNOSIS — S96811A Strain of other specified muscles and tendons at ankle and foot level, right foot, initial encounter: Secondary | ICD-10-CM | POA: Insufficient documentation

## 2019-12-11 DIAGNOSIS — Z8371 Family history of colonic polyps: Secondary | ICD-10-CM | POA: Diagnosis not present

## 2019-12-11 DIAGNOSIS — X58XXXA Exposure to other specified factors, initial encounter: Secondary | ICD-10-CM | POA: Diagnosis not present

## 2019-12-11 DIAGNOSIS — D1723 Benign lipomatous neoplasm of skin and subcutaneous tissue of right leg: Secondary | ICD-10-CM | POA: Insufficient documentation

## 2019-12-11 DIAGNOSIS — Z79899 Other long term (current) drug therapy: Secondary | ICD-10-CM | POA: Diagnosis not present

## 2019-12-11 DIAGNOSIS — D1724 Benign lipomatous neoplasm of skin and subcutaneous tissue of left leg: Secondary | ICD-10-CM | POA: Diagnosis not present

## 2019-12-11 DIAGNOSIS — Z91041 Radiographic dye allergy status: Secondary | ICD-10-CM | POA: Insufficient documentation

## 2019-12-11 DIAGNOSIS — Z806 Family history of leukemia: Secondary | ICD-10-CM | POA: Diagnosis not present

## 2019-12-11 DIAGNOSIS — M7672 Peroneal tendinitis, left leg: Secondary | ICD-10-CM | POA: Diagnosis not present

## 2019-12-11 DIAGNOSIS — D1739 Benign lipomatous neoplasm of skin and subcutaneous tissue of other sites: Secondary | ICD-10-CM | POA: Diagnosis present

## 2019-12-11 HISTORY — PX: MASS EXCISION: SHX2000

## 2019-12-11 HISTORY — PX: TENDON REPAIR: SHX5111

## 2019-12-11 SURGERY — TENDON REPAIR
Anesthesia: General | Site: Ankle | Laterality: Bilateral

## 2019-12-11 MED ORDER — SUGAMMADEX SODIUM 200 MG/2ML IV SOLN
INTRAVENOUS | Status: DC | PRN
  Administered 2019-12-11: 200 mg via INTRAVENOUS

## 2019-12-11 MED ORDER — PHENYLEPHRINE HCL (PRESSORS) 10 MG/ML IV SOLN
INTRAVENOUS | Status: DC | PRN
  Administered 2019-12-11 (×6): 100 ug via INTRAVENOUS

## 2019-12-11 MED ORDER — ONDANSETRON HCL 4 MG/2ML IJ SOLN
INTRAMUSCULAR | Status: DC | PRN
  Administered 2019-12-11: 4 mg via INTRAVENOUS

## 2019-12-11 MED ORDER — FAMOTIDINE 20 MG PO TABS: 20.0000 mg | ORAL_TABLET | Freq: Once | ORAL | Status: AC

## 2019-12-11 MED ORDER — ONDANSETRON HCL 4 MG PO TABS: 4.0000 mg | ORAL_TABLET | Freq: Four times a day (QID) | ORAL | Status: DC | PRN

## 2019-12-11 MED ORDER — ONDANSETRON HCL 4 MG/2ML IJ SOLN: 4.0000 mg | Freq: Four times a day (QID) | INTRAMUSCULAR | Status: DC | PRN

## 2019-12-11 MED ORDER — FAMOTIDINE 20 MG PO TABS
ORAL_TABLET | ORAL | Status: AC
  Administered 2019-12-11: 20 mg via ORAL
  Filled 2019-12-11: qty 1

## 2019-12-11 MED ORDER — MIDAZOLAM HCL 2 MG/2ML IJ SOLN
INTRAMUSCULAR | Status: DC | PRN
  Administered 2019-12-11: 2 mg via INTRAVENOUS

## 2019-12-11 MED ORDER — LIDOCAINE HCL (PF) 1 % IJ SOLN
INTRAMUSCULAR | Status: AC
  Filled 2019-12-11: qty 30

## 2019-12-11 MED ORDER — DEXAMETHASONE SODIUM PHOSPHATE 10 MG/ML IJ SOLN
INTRAMUSCULAR | Status: DC | PRN
  Administered 2019-12-11: 10 mg via INTRAVENOUS

## 2019-12-11 MED ORDER — PROPOFOL 500 MG/50ML IV EMUL
INTRAVENOUS | Status: DC | PRN
  Administered 2019-12-11: 10 ug/kg/min via INTRAVENOUS

## 2019-12-11 MED ORDER — CEFAZOLIN SODIUM-DEXTROSE 2-4 GM/100ML-% IV SOLN
INTRAVENOUS | Status: AC
  Filled 2019-12-11: qty 100

## 2019-12-11 MED ORDER — FENTANYL CITRATE (PF) 100 MCG/2ML IJ SOLN
INTRAMUSCULAR | Status: AC
  Filled 2019-12-11: qty 2

## 2019-12-11 MED ORDER — ORAL CARE MOUTH RINSE: 15.0000 mL | Freq: Once | OROMUCOSAL | Status: AC

## 2019-12-11 MED ORDER — NEOMYCIN-POLYMYXIN B GU 40-200000 IR SOLN
Status: DC | PRN
  Administered 2019-12-11: 2 mL

## 2019-12-11 MED ORDER — ROCURONIUM BROMIDE 100 MG/10ML IV SOLN
INTRAVENOUS | Status: DC | PRN
  Administered 2019-12-11 (×2): 20 mg via INTRAVENOUS
  Administered 2019-12-11: 60 mg via INTRAVENOUS

## 2019-12-11 MED ORDER — PHENYLEPHRINE HCL (PRESSORS) 10 MG/ML IV SOLN
INTRAVENOUS | Status: AC
  Filled 2019-12-11: qty 1

## 2019-12-11 MED ORDER — MIDAZOLAM HCL 2 MG/2ML IJ SOLN
INTRAMUSCULAR | Status: AC
  Filled 2019-12-11: qty 2

## 2019-12-11 MED ORDER — PROPOFOL 10 MG/ML IV BOLUS
INTRAVENOUS | Status: AC
  Filled 2019-12-11: qty 20

## 2019-12-11 MED ORDER — OXYCODONE-ACETAMINOPHEN 5-325 MG PO TABS
1.0000 | ORAL_TABLET | Freq: Four times a day (QID) | ORAL | Status: DC | PRN
  Administered 2019-12-11: 1 via ORAL

## 2019-12-11 MED ORDER — LACTATED RINGERS IV SOLN: INTRAVENOUS | Status: DC

## 2019-12-11 MED ORDER — BUPIVACAINE LIPOSOME 1.3 % IJ SUSP
INTRAMUSCULAR | Status: DC | PRN
  Administered 2019-12-11: 10 mL
  Administered 2019-12-11: 20 mL
  Administered 2019-12-11: 10 mL

## 2019-12-11 MED ORDER — NEOMYCIN-POLYMYXIN B GU 40-200000 IR SOLN
Status: AC
  Filled 2019-12-11: qty 2

## 2019-12-11 MED ORDER — CHLORHEXIDINE GLUCONATE 0.12 % MT SOLN
OROMUCOSAL | Status: AC
  Administered 2019-12-11: 15 mL via OROMUCOSAL
  Filled 2019-12-11: qty 15

## 2019-12-11 MED ORDER — CEFAZOLIN SODIUM-DEXTROSE 2-4 GM/100ML-% IV SOLN
2.0000 g | INTRAVENOUS | Status: AC
  Administered 2019-12-11: 2 g via INTRAVENOUS

## 2019-12-11 MED ORDER — FENTANYL CITRATE (PF) 250 MCG/5ML IJ SOLN
INTRAMUSCULAR | Status: DC | PRN
Start: 2019-12-11 — End: 2019-12-11
  Administered 2019-12-11 (×2): 50 ug via INTRAVENOUS

## 2019-12-11 MED ORDER — SEVOFLURANE IN SOLN
RESPIRATORY_TRACT | Status: AC
  Filled 2019-12-11: qty 250

## 2019-12-11 MED ORDER — EPHEDRINE SULFATE 50 MG/ML IJ SOLN
INTRAMUSCULAR | Status: DC | PRN
  Administered 2019-12-11: 10 mg via INTRAVENOUS

## 2019-12-11 MED ORDER — BUPIVACAINE LIPOSOME 1.3 % IJ SUSP
INTRAMUSCULAR | Status: AC
  Filled 2019-12-11: qty 20

## 2019-12-11 MED ORDER — DEXAMETHASONE SODIUM PHOSPHATE 10 MG/ML IJ SOLN
INTRAMUSCULAR | Status: AC
  Filled 2019-12-11: qty 1

## 2019-12-11 MED ORDER — ROCURONIUM BROMIDE 10 MG/ML (PF) SYRINGE
PREFILLED_SYRINGE | INTRAVENOUS | Status: AC
  Filled 2019-12-11: qty 10

## 2019-12-11 MED ORDER — LIDOCAINE HCL (PF) 2 % IJ SOLN
INTRAMUSCULAR | Status: AC
  Filled 2019-12-11: qty 10

## 2019-12-11 MED ORDER — OXYCODONE-ACETAMINOPHEN 5-325 MG PO TABS
ORAL_TABLET | ORAL | Status: AC
  Filled 2019-12-11: qty 1

## 2019-12-11 MED ORDER — BUPIVACAINE HCL (PF) 0.25 % IJ SOLN
INTRAMUSCULAR | Status: AC
  Filled 2019-12-11: qty 30

## 2019-12-11 MED ORDER — ONDANSETRON HCL 4 MG/2ML IJ SOLN: 4.0000 mg | Freq: Once | INTRAMUSCULAR | Status: DC | PRN

## 2019-12-11 MED ORDER — PROPOFOL 10 MG/ML IV BOLUS
INTRAVENOUS | Status: DC | PRN
  Administered 2019-12-11: 130 mg via INTRAVENOUS

## 2019-12-11 MED ORDER — CHLORHEXIDINE GLUCONATE 0.12 % MT SOLN: 15.0000 mL | Freq: Once | OROMUCOSAL | Status: AC

## 2019-12-11 MED ORDER — OXYCODONE-ACETAMINOPHEN 5-325 MG PO TABS: 1.0000 | ORAL_TABLET | Freq: Four times a day (QID) | ORAL | 0 refills | Status: DC | PRN

## 2019-12-11 MED ORDER — ONDANSETRON HCL 4 MG/2ML IJ SOLN
INTRAMUSCULAR | Status: AC
  Filled 2019-12-11: qty 2

## 2019-12-11 MED ORDER — EPHEDRINE 5 MG/ML INJ
INTRAVENOUS | Status: AC
  Filled 2019-12-11: qty 10

## 2019-12-11 MED ORDER — BUPIVACAINE HCL (PF) 0.5 % IJ SOLN
INTRAMUSCULAR | Status: AC
  Filled 2019-12-11: qty 60

## 2019-12-11 MED ORDER — FENTANYL CITRATE (PF) 100 MCG/2ML IJ SOLN: 25.0000 ug | INTRAMUSCULAR | Status: DC | PRN

## 2019-12-11 MED ORDER — POVIDONE-IODINE 10 % EX SWAB
2.0000 "application " | Freq: Once | CUTANEOUS | Status: AC
  Administered 2019-12-11: 2 via TOPICAL

## 2019-12-11 MED ORDER — LIDOCAINE HCL (CARDIAC) PF 100 MG/5ML IV SOSY
PREFILLED_SYRINGE | INTRAVENOUS | Status: DC | PRN
  Administered 2019-12-11: 60 mg via INTRAVENOUS

## 2019-12-11 SURGICAL SUPPLY — 68 items
BLADE SURG 15 STRL LF DISP TIS (BLADE) ×4 IMPLANT
BLADE SURG 15 STRL SS (BLADE) ×6
BLADE SURG MINI STRL (BLADE) ×3 IMPLANT
BNDG CMPR STD VLCR NS LF 5.8X4 (GAUZE/BANDAGES/DRESSINGS) ×4
BNDG COHESIVE 4X5 TAN STRL (GAUZE/BANDAGES/DRESSINGS) ×3 IMPLANT
BNDG CONFORM 2 STRL LF (GAUZE/BANDAGES/DRESSINGS) ×3 IMPLANT
BNDG CONFORM 3 STRL LF (GAUZE/BANDAGES/DRESSINGS) ×6 IMPLANT
BNDG ELASTIC 4X5.8 VLCR NS LF (GAUZE/BANDAGES/DRESSINGS) ×6 IMPLANT
BNDG ESMARK 4X12 TAN STRL LF (GAUZE/BANDAGES/DRESSINGS) ×3 IMPLANT
BNDG GAUZE 4.5X4.1 6PLY STRL (MISCELLANEOUS) ×6 IMPLANT
BOOT STEPPER DURA MED (SOFTGOODS) ×3 IMPLANT
CANISTER SUCT 1200ML W/VALVE (MISCELLANEOUS) ×3 IMPLANT
COVER WAND RF STERILE (DRAPES) ×3 IMPLANT
CUFF TOURN SGL QUICK 18X4 (TOURNIQUET CUFF) ×6 IMPLANT
CUFF TOURN SGL QUICK 24 (TOURNIQUET CUFF)
CUFF TRNQT CYL 24X4X16.5-23 (TOURNIQUET CUFF) IMPLANT
DRAPE FLUOR MINI C-ARM 54X84 (DRAPES) IMPLANT
DURAPREP 26ML APPLICATOR (WOUND CARE) ×6 IMPLANT
ELECT REM PT RETURN 9FT ADLT (ELECTROSURGICAL) ×3
ELECTRODE REM PT RTRN 9FT ADLT (ELECTROSURGICAL) ×2 IMPLANT
GAUZE 4X4 16PLY RFD (DISPOSABLE) ×3 IMPLANT
GAUZE SPONGE 4X4 12PLY STRL (GAUZE/BANDAGES/DRESSINGS) ×9 IMPLANT
GAUZE XEROFORM 1X8 LF (GAUZE/BANDAGES/DRESSINGS) ×9 IMPLANT
GLOVE BIO SURGEON STRL SZ7.5 (GLOVE) ×3 IMPLANT
GLOVE INDICATOR 8.0 STRL GRN (GLOVE) ×3 IMPLANT
GOWN STRL REUS W/ TWL XL LVL3 (GOWN DISPOSABLE) ×4 IMPLANT
GOWN STRL REUS W/TWL XL LVL3 (GOWN DISPOSABLE) ×6
HANDLE YANKAUER SUCT BULB TIP (MISCELLANEOUS) ×3 IMPLANT
KIT SUTURE 1.8 Q-FIX DISP (KITS) IMPLANT
KIT TURNOVER KIT A (KITS) ×3 IMPLANT
LABEL OR SOLS (LABEL) ×3 IMPLANT
NDL MAYO CATGUT SZ5 (NEEDLE)
NDL SUT 5 .5 CRC TPR PNT MAYO (NEEDLE) IMPLANT
NEEDLE FILTER BLUNT 18X 1/2SAF (NEEDLE) ×1
NEEDLE FILTER BLUNT 18X1 1/2 (NEEDLE) ×2 IMPLANT
NEEDLE HYPO 25X1 1.5 SAFETY (NEEDLE) ×3 IMPLANT
NS IRRIG 500ML POUR BTL (IV SOLUTION) ×3 IMPLANT
PACK EXTREMITY (MISCELLANEOUS) ×3 IMPLANT
PAD CAST CTTN 4X4 STRL (SOFTGOODS) ×4 IMPLANT
PADDING CAST COTTON 4X4 STRL (SOFTGOODS) ×6
RASP SM TEAR CROSS CUT (RASP) ×3 IMPLANT
SOL PREP PVP 2OZ (MISCELLANEOUS)
SOLUTION PREP PVP 2OZ (MISCELLANEOUS) IMPLANT
SPLINT CAST 1 STEP 5X30 WHT (MISCELLANEOUS) IMPLANT
SPLINT FAST PLASTER 5X30 (CAST SUPPLIES)
SPLINT PLASTER CAST FAST 5X30 (CAST SUPPLIES) IMPLANT
SPONGE LAP 18X18 RF (DISPOSABLE) IMPLANT
STOCKINETTE M/LG 89821 (MISCELLANEOUS) ×6 IMPLANT
STRIP CLOSURE SKIN 1/2X4 (GAUZE/BANDAGES/DRESSINGS) ×6 IMPLANT
SUT ETHIBOND 2-0 (SUTURE) ×3 IMPLANT
SUT MNCRL 4-0 (SUTURE) ×9
SUT MNCRL 4-0 27XMFL (SUTURE) ×6
SUT MNCRL+ 5-0 VIOLET P-3 (SUTURE) IMPLANT
SUT MONOCRYL 5-0 (SUTURE)
SUT VIC AB 0 SH 27 (SUTURE) ×3 IMPLANT
SUT VIC AB 2-0 SH 27 (SUTURE)
SUT VIC AB 2-0 SH 27XBRD (SUTURE) IMPLANT
SUT VIC AB 3-0 SH 27 (SUTURE) ×15
SUT VIC AB 3-0 SH 27X BRD (SUTURE) ×10 IMPLANT
SUT VIC AB 4-0 FS2 27 (SUTURE) ×3 IMPLANT
SUT VICRYL AB 3-0 FS1 BRD 27IN (SUTURE) IMPLANT
SUTURE MNCRL 4-0 27XMF (SUTURE) ×6 IMPLANT
SWABSTK COMLB BENZOIN TINCTURE (MISCELLANEOUS) ×3 IMPLANT
SYR 10ML LL (SYRINGE) ×6 IMPLANT
SYR 3ML LL SCALE MARK (SYRINGE) IMPLANT
SYR BULB IRRIG 60ML STRL (SYRINGE) ×3 IMPLANT
TOWEL OR 17X26 4PK STRL BLUE (TOWEL DISPOSABLE) ×3 IMPLANT
WAND TOPAZ MICRO DEBRIDER (MISCELLANEOUS) ×3 IMPLANT

## 2019-12-11 NOTE — Discharge Instructions (Signed)
[place] DR. TROXLER, DR. Vickki Muff, AND DR. Caballo   1. Take your medication as prescribed.  Pain medication should be taken only as needed.  2. Keep the dressing clean, dry and intact.  3. Keep your foot elevated above the heart level for the first 48 hours.  4. Walking to the bathroom and brief periods of walking are acceptable, unless we have instructed you to be non-weight bearing.  5. Always wear your post-op shoe when walking.  Always use your crutches if you are to be non-weight bearing.  6. Do not take a shower. Baths are permissible as long as the foot is kept out of the water.   7. Every hour you are awake:  - Bend your knee 15 times. - Flex left foot 15 times - Massage left calf 15 times  8. Call [facility] [phone]) if any of the following problems occur: - You develop a temperature or fever. - The bandage becomes saturated with blood. - Medication does not stop your pain. - Injury of the foot occurs. - Any symptoms of infection including redness, odor, or red streaks running from wound.   AMBULATORY SURGERY  DISCHARGE INSTRUCTIONS   1) The drugs that you were given will stay in your system until tomorrow so for the next 24 hours you should not:  A) Drive an automobile B) Make any legal decisions C) Drink any alcoholic beverage   2) You may resume regular meals tomorrow.  Today it is better to start with liquids and gradually work up to solid foods.  You may eat anything you prefer, but it is better to start with liquids, then soup and crackers, and gradually work up to solid foods.   3) Please notify your doctor immediately if you have any unusual bleeding, trouble breathing, redness and pain at the surgery site, drainage, fever, or pain not relieved by medication.    4) Additional Instructions:        Please contact your  physician with any problems or Same Day Surgery at [phone], Monday through Friday 6 am to 4 pm, or [place] at [facility] number at [phone].

## 2019-12-11 NOTE — H&P (Signed)
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  The various methods of treatment have been discussed with the patient.  No guarantees were given.  After consideration of risks, benefits and other options for treatment, the patient has consented to surgery.  I have reviewed the patients' chart and labs.     A history and physical examination was performed in my office.  The patient was reexamined.  There have been no changes to this history and physical examination.  Samara Deist A

## 2019-12-11 NOTE — Anesthesia Procedure Notes (Signed)
Procedure Name: Intubation Date/Time: 12/11/2019 7:38 AM Performed by: Eben Burow, CRNA Pre-anesthesia Checklist: Patient identified, Emergency Drugs available, Suction available and Patient being monitored Patient Re-evaluated:Patient Re-evaluated prior to induction Oxygen Delivery Method: Circle system utilized Preoxygenation: Pre-oxygenation with 100% oxygen Induction Type: IV induction Ventilation: Mask ventilation without difficulty Laryngoscope Size: Miller and 2 Grade View: Grade I Tube type: Oral Tube size: 7.0 mm Number of attempts: 1 Placement Confirmation: ETT inserted through vocal cords under direct vision,  positive ETCO2 and breath sounds checked- equal and bilateral Secured at: 19 cm Tube secured with: Tape Dental Injury: Teeth and Oropharynx as per pre-operative assessment

## 2019-12-11 NOTE — Op Note (Signed)
Operative note   Surgeon:Bennetta Rudden Lawyer: None    Preop diagnosis: 1.  Soft tissue mass/lipoma right ankle 2.  Torn peroneal tendon right lateral ankle 3.  Soft tissue mass/lipoma left ankle 4.  Peroneal tendinitis left ankle    Postop diagnosis: 1.  Soft tissue mass/lipoma right ankle 2.  Torn peroneal brevis tendon right ankle 3.  Peroneal longus tenosynovitis right ankle 4.  Soft tissue mass/lipoma left ankle 5.  Peroneal brevis tendinosis left ankle 6.  Peroneal longus tenosynovitis left ankle    Procedure: 1.  Excision soft tissue mass/lipoma anterolateral right ankle 2.  Peroneal brevis repair right lateral ankle 3.  Peroneal longus tenolysis right lateral ankle 4.  Excision soft tissue mass/lipoma anterolateral left ankle 5.  Peroneal brevis tendon repair left lateral ankle 6.  Peroneal longus tenolysis left lateral ankle    EBL: Minimal    Anesthesia:local and general.  Local consisted of a mixture of 0.25% bupivacaine and Exparel long-acting anesthetic.  A total of 40 cc was used split between the 2 ankle sites.    Hemostasis: Mid calf tourniquet inflated to 200 mmHg for 80 minutes on the right side and 70 minutes on the left side    Specimen: Large lipoma to right and left ankles as well as peroneal brevis tendon tear right lateral ankle    Complications: None    Operative indications:Caroline Lamb is an 59 y.o. that presents today for surgical intervention.  The risks/benefits/alternatives/complications have been discussed and consent has been given.    Procedure:  Patient was brought into the OR and placed on the operating table in thesupine position. After anesthesia was obtained the lower extremity was prepped and draped in usual sterile fashion.  Initially attention was directed to the anterolateral aspect of the right ankle where an incision was made over lying the large lipoma to the anterolateral aspect.  Sharp and blunt dissection were carried around the  lipomatous mass.  Care was taken to retract all vital neurovascular structures and Bovie cauterize any bleeders.  The intermediate dorsal cutaneous nerve was noted extending through the lipoma and was dissected away and retracted throughout the entire procedure.  At this time the large soft tissue mass that grossly appeared to be a lipoma was removed.  This measured 4 x 6 cm in size.  The wound was flushed with copious amounts of irrigation.  Closure was performed with a 4-0 Vicryl for the subcutaneous tissue and a 4-0 Monocryl for skin.  Attention was directed more lateral along the peroneal tendon at the lateral ankle region where a longitudinal incision along the peroneal tendons was performed.  Sharp and blunt dissection was carried down to the peroneal tendon sheath.  The sheath was then entered.  Inspection revealed a split peroneal brevis tendon tear with a low-lying muscle belly.  There was noted to be tenosynovitis diffusely to the peroneal longus tendon.  The split tendon tear was debrided and the smaller portion of the tendon tear was excised and sent for pathological examination.  At this time the tendon was then curetted and the edges roughened.  The tendon was then tubularized with a 3-0 Vicryl.  The peroneal longus tendon and tenosynovitis was removed from the surgical field at this time.  A portion of the low-lying muscle belly of the brevis was resected back.  Both tendons were infiltrated with the Topaz wand.  After sterile flush closure was then performed with a 3-0 Vicryl for the peroneal  sheath.  The peroneal retinaculum was reapproximated with a 2-0 Ethibond in a pants over vest fashion.  4-0 Vicryl for the subcutaneous tissue and a 4-0 Monocryl for skin was used.  The tourniquet was then deflated on the right side.  Attention was directed to the left ankle where the tourniquet was then inflated to the mid calf.  Attention was directed to the anterolateral aspect of the ankle where an  incision was made overlying the soft tissue mass.  Sharp and blunt dissection was carried down to the fascial region.  Care was taken to retract all vital neurovascular structures.  The intermediate dorsal cutaneous nerve was noted and retracted throughout the procedure and removed from the soft tissue mass.  The mass was then removed in toto.  This measured 3.5 x 4.5 cm.  The wound was flushed with copious amounts of irrigation.  Closure was then performed with a 4-0 Vicryl for the deeper layer and a 4-0 Monocryl for the skin.  Attention was directed laterally along the peroneal tendons where longitudinal incision was performed.  Sharp and blunt dissection was carried down to the tendon sheath.  The tendon sheath was then entered.  There was noted to be tenosynovitis to both tendons.  The peroneal brevis tendon was quite flat and mildly irritated.  This appeared to be somewhat abnormal due to the very flat nature along the posterior aspect of the fibula.  At this time this was then tubularized after edges were roughened.  This was repaired with a 3-0 Vicryl.  The tenosynovitis was removed from the surgical field from both the brevis and longus tendon.  Both tendons were infiltrated with the Topaz wand.  Closure was then performed after sterile flush.  Closure was performed with a 3-0 Vicryl for the tendon sheath.  A 2-0 Ethibond was used in a pants over vest fashion for the peroneal retinaculum.  4-0 Vicryl for the subcutaneous tissue and 4-0 Monocryl for the skin.    Patient tolerated the procedure and anesthesia well.  Was transported from the OR to the PACU with all vital signs stable and vascular status intact. To be discharged per routine protocol.  Will follow up in approximately 1 week in the outpatient clinic.  Patient was placed in an equalizer walker boot for the right side.  Soft dressing for the left side and a flat shoe on the left side.  Percocet prescription was sent to her pharmacy.

## 2019-12-11 NOTE — Transfer of Care (Signed)
Immediate Anesthesia Transfer of Care Note  Patient: LENNI RECKNER  Procedure(s) Performed: FLEXOR TENDON REPAIR (Bilateral Ankle) EXCISION SOFT TISSUE ANKLE RIGHT AND LEFT (Bilateral )  Patient Location: PACU  Anesthesia Type:General  Level of Consciousness: drowsy  Airway & Oxygen Therapy: Patient Spontanous Breathing and Patient connected to face mask oxygen  Post-op Assessment: Report given to RN and Post -op Vital signs reviewed and stable  Post vital signs: Reviewed and stable  Last Vitals:  Vitals Value Taken Time  BP 115/57 12/11/19 1042  Temp 36.4 C 12/11/19 1042  Pulse 82 12/11/19 1044  Resp 12 12/11/19 1044  SpO2 99 % 12/11/19 1044  Vitals shown include unvalidated device data.  Last Pain:  Vitals:   12/11/19 0627  TempSrc: Oral  PainSc: 0-No pain         Complications: No complications documented.

## 2019-12-11 NOTE — Anesthesia Preprocedure Evaluation (Signed)
Anesthesia Evaluation  Patient identified by MRN, date of birth, ID band Patient awake    Reviewed: Allergy & Precautions, H&P , NPO status , Patient's Chart, lab work & pertinent test results, reviewed documented beta blocker date and time   Airway Mallampati: II  TM Distance: >3 FB Neck ROM: full    Dental  (+) Teeth Intact   Pulmonary asthma ,    Pulmonary exam normal        Cardiovascular Exercise Tolerance: Good hypertension, On Medications negative cardio ROS Normal cardiovascular exam Rhythm:regular Rate:Normal     Neuro/Psych Anxiety negative neurological ROS  negative psych ROS   GI/Hepatic negative GI ROS, Neg liver ROS,   Endo/Other  Hypothyroidism   Renal/GU negative Renal ROS  negative genitourinary   Musculoskeletal   Abdominal   Peds  Hematology negative hematology ROS (+)   Anesthesia Other Findings Past Medical History: No date: Anxiety No date: Arthritis     Comment:  uses Volteran No date: Asthma     Comment:  controlled No date: breast No date: Hyperlipidemia No date: Hypertension     Comment:  not on medication No date: Hypothyroidism Past Surgical History: No date: cesarean     Comment:  x 2 02/07/2015: COLONOSCOPY WITH PROPOFOL; N/A     Comment:  Procedure: COLONOSCOPY WITH PROPOFOL;  Surgeon: Hulen Luster, MD;  Location: [facility] ENDOSCOPY;  Service:               Gastroenterology;  Laterality: N/A; No date: DIAGNOSTIC LAPAROSCOPY No date: TONSILLECTOMY   Reproductive/Obstetrics negative OB ROS                             Anesthesia Physical Anesthesia Plan  ASA: II  Anesthesia Plan: General ETT   Post-op Pain Management:    Induction:   PONV Risk Score and Plan: 4 or greater  Airway Management Planned:   Additional Equipment:   Intra-op Plan:   Post-operative Plan:   Informed Consent: I have reviewed the patients History and  Physical, chart, labs and discussed the procedure including the risks, benefits and alternatives for the proposed anesthesia with the patient or authorized representative who has indicated his/her understanding and acceptance.     Dental Advisory Given  Plan Discussed with: CRNA  Anesthesia Plan Comments:         Anesthesia Quick Evaluation

## 2019-12-14 ENCOUNTER — Encounter: Payer: Self-pay | Admitting: Podiatry

## 2019-12-14 LAB — SURGICAL PATHOLOGY

## 2019-12-14 NOTE — Anesthesia Postprocedure Evaluation (Signed)
Anesthesia Post Note  Patient: Caroline Lamb  Procedure(s) Performed: FLEXOR TENDON REPAIR (Bilateral Ankle) EXCISION SOFT TISSUE ANKLE RIGHT AND LEFT (Bilateral )  Patient location during evaluation: PACU Anesthesia Type: General Level of consciousness: awake and alert Pain management: pain level controlled Vital Signs Assessment: post-procedure vital signs reviewed and stable Respiratory status: spontaneous breathing, nonlabored ventilation, respiratory function stable and patient connected to nasal cannula oxygen Cardiovascular status: blood pressure returned to baseline and stable Postop Assessment: no apparent nausea or vomiting Anesthetic complications: no   No complications documented.   Last Vitals:  Vitals:   12/11/19 1127 12/11/19 1141  BP: 133/71 130/61  Pulse: 80 81  Resp: 15 16  Temp: (!) 36.4 C (!) 36.2 C  SpO2: 97% 99%    Last Pain:  Vitals:   12/11/19 1141  TempSrc: Temporal  PainSc: [place] Tyreona Panjwani

## 2020-12-09 ENCOUNTER — Encounter: Payer: Self-pay | Admitting: Emergency Medicine

## 2020-12-09 ENCOUNTER — Other Ambulatory Visit: Payer: Self-pay

## 2020-12-09 ENCOUNTER — Ambulatory Visit
Admission: EM | Admit: 2020-12-09 | Discharge: 2020-12-09 | Disposition: A | Discharge status: Home / Self Care | Payer: BC Managed Care – PPO | Attending: Physician Assistant | Admitting: Physician Assistant

## 2020-12-09 DIAGNOSIS — R350 Frequency of micturition: Secondary | ICD-10-CM | POA: Insufficient documentation

## 2020-12-09 DIAGNOSIS — R5383 Other fatigue: Secondary | ICD-10-CM | POA: Diagnosis not present

## 2020-12-09 DIAGNOSIS — R824 Acetonuria: Secondary | ICD-10-CM | POA: Diagnosis not present

## 2020-12-09 LAB — PHOSPHORUS: Phosphorus: 3.3 mg/dL (ref 2.5–4.6)

## 2020-12-09 LAB — URINALYSIS, COMPLETE (UACMP) WITH MICROSCOPIC
Bacteria, UA: NONE SEEN
Bilirubin Urine: NEGATIVE
Glucose, UA: NEGATIVE mg/dL
Hgb urine dipstick: NEGATIVE
Leukocytes,Ua: NEGATIVE
Nitrite: NEGATIVE
Protein, ur: NEGATIVE mg/dL
Specific Gravity, Urine: >1.03 — ABNORMAL HIGH (ref 1.005–1.030)
pH: 5.5 (ref 5.0–8.0)

## 2020-12-09 LAB — CBC WITH DIFFERENTIAL/PLATELET
Abs Immature Granulocytes: 0.01 10*3/uL (ref 0.00–0.07)
Basophils Absolute: 0 10*3/uL (ref 0.0–0.1)
Basophils Relative: 1 %
Eosinophils Absolute: 0.1 10*3/uL (ref 0.0–0.5)
Eosinophils Relative: 2 %
HCT: 41.1 % (ref 36.0–46.0)
Hemoglobin: 13.7 g/dL (ref 12.0–15.0)
Immature Granulocytes: 0 %
Lymphocytes Relative: 44 %
Lymphs Abs: 2 10*3/uL (ref 0.7–4.0)
MCH: 29.8 pg (ref 26.0–34.0)
MCHC: 33.3 g/dL (ref 30.0–36.0)
MCV: 89.3 fL (ref 80.0–100.0)
Monocytes Absolute: 0.3 10*3/uL (ref 0.1–1.0)
Monocytes Relative: 8 %
Neutro Abs: 2.1 10*3/uL (ref 1.7–7.7)
Neutrophils Relative %: 45 %
Platelets: 228 10*3/uL (ref 150–400)
RBC: 4.6 MIL/uL (ref 3.87–5.11)
RDW: 12.8 % (ref 11.5–15.5)
WBC: 4.5 10*3/uL (ref 4.0–10.5)
nRBC: 0 % (ref 0.0–0.2)

## 2020-12-09 LAB — BASIC METABOLIC PANEL
Anion gap: 5 (ref 5–15)
BUN: 14 mg/dL (ref 6–20)
CO2: 28 mmol/L (ref 22–32)
Calcium: 9.3 mg/dL (ref 8.9–10.3)
Chloride: 106 mmol/L (ref 98–111)
Creatinine, Ser: 0.47 mg/dL (ref 0.44–1.00)
GFR, Estimated: >60 mL/min (ref >60)
Glucose, Bld: 106 mg/dL — ABNORMAL HIGH (ref 70–99)
Potassium: 4 mmol/L (ref 3.5–5.1)
Sodium: 139 mmol/L (ref 135–145)

## 2020-12-09 LAB — MAGNESIUM: Magnesium: 2.2 mg/dL (ref 1.7–2.4)

## 2020-12-09 MED ORDER — ONDANSETRON 8 MG PO TBDP: 8.0000 mg | ORAL_TABLET | Freq: Three times a day (TID) | ORAL | 0 refills | Status: DC | PRN

## 2020-12-09 NOTE — ED Provider Notes (Addendum)
[facility]    CSN: [id number] Arrival date & time: 12/09/20  1751      History   Chief Complaint Chief Complaint  Patient presents with   Fatigue   Back Pain    HPI Caroline Lamb is a 60 y.o. female.   Patient is a 60 year old female who presents with complaint of possible UTI.  Patient reporting bilateral back pain, nausea, headache, and extreme fatigue.  Patient reports bilateral lower back pain right side worse than left.  Patient states she was seen with a telehealth visit with the same symptoms a few weeks ago and took a week of antibiotics.  She states she initially got better but is feeling worse again.  She states the symptoms of fatigue and nausea and headache have worsened over the last week.  She reports frequency but small amounts of urination.  She denies any burning but does report a sweet or sometimes vinegar smell to her urine.  She has been taking Tylenol for her symptoms.  Patient does have a history of breast cancer and has continued her follow-up with her cancer team.  She reports her next appointment is next month.   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   Hyperlipidemia    Urinary frequency 01/04/2017    Past Surgical History:  Procedure Laterality Date   cesarean     x 2   COLONOSCOPY WITH PROPOFOL N/A 02/07/2015   Procedure: COLONOSCOPY WITH PROPOFOL;  Surgeon: Hulen Luster, MD;  Location: [facility] ENDOSCOPY;  Service: Gastroenterology;  Laterality: N/A;   DIAGNOSTIC LAPAROSCOPY     MASS EXCISION Bilateral 12/11/2019   Procedure: EXCISION SOFT TISSUE ANKLE RIGHT AND LEFT;  Surgeon: Samara Deist, DPM;  Location: [facility] ORS;  Service: Podiatry;  Laterality: Bilateral;   TENDON REPAIR Bilateral  12/11/2019   Procedure: FLEXOR TENDON REPAIR;  Surgeon: Samara Deist, DPM;  Location: [facility] ORS;  Service: Podiatry;  Laterality: Bilateral;   TONSILLECTOMY      OB History   No obstetric history on file.      Home Medications    Prior to Admission medications   Medication Sig Start Date End Date Taking? Authorizing Provider  exemestane (AROMASIN) 25 MG tablet Take 25 mg by mouth at bedtime.  11/20/19  Yes [provider]  levothyroxine (SYNTHROID) 100 MCG tablet Take 100 mcg by mouth daily before breakfast.    Yes [provider]  liothyronine (CYTOMEL) 5 MCG tablet Take 5 mcg by mouth daily before breakfast. 11/24/19  Yes [provider]  ondansetron (ZOFRAN ODT) 8 MG disintegrating tablet Take 1 tablet (8 mg total) by mouth every 8 (eight) hours as needed for nausea or vomiting. 12/09/20  Yes Luvenia Redden, PA-C  acetaminophen (TYLENOL) 500 MG tablet Take 500-1,000 mg by mouth every 6 (six) hours as needed for mild pain.    [provider]  diclofenac Sodium (VOLTAREN) 1 % GEL Apply 2 g topically 4 (four) times daily as needed for pain. 09/25/19   [provider]  nitrofurantoin, macrocrystal-monohydrate, (MACROBID) 100 MG capsule Take 100 mg by mouth 2 (two) times daily.  12/02/19   [provider]  oxyCODONE-acetaminophen (PERCOCET) 5-325 MG tablet Take 1-2 tablets by mouth every 6 (six) hours as needed for up to 30 doses for severe pain. 12/11/19   Samara Deist, DPM  pantoprazole (PROTONIX) 40 MG tablet Take 1 tablet (40 mg total) by mouth 2 (two) times daily. Patient not taking: No sig reported 11/22/17 11/22/18  Merlyn Lot, MD  Polyethyl Glycol-Propyl Glycol (LUBRICANT EYE DROPS) 0.4-0.3 % SOLN Place 1 drop into both eyes 3 (three) times daily as needed (dry/irritated eyes.).    [provider]    Family History Family History  Problem Relation Age of Onset   Hematuria Father    Prostate cancer Father     Hematuria Mother    Tuberculosis Paternal Grandfather    Kidney cancer Neg Hx    Kidney disease Neg Hx    Sickle cell trait Neg Hx     Social History Social History   Tobacco Use   Smoking status: Never   Smokeless tobacco: Never  Vaping Use   Vaping Use: Never used  Substance Use Topics   Alcohol use: No    Comment: occassinally   Drug use: No     Allergies   Contrast media [iodinated diagnostic agents]   Review of Systems Review of Systems as noted above in HPI.  Other systems reviewed and found to be negative   Physical Exam Triage Vital Signs ED Triage Vitals  Enc Vitals Group     BP 12/09/20 0933 128/84     Pulse Rate 12/09/20 0933 (!) 58     Resp 12/09/20 0933 16     Temp 12/09/20 0933 98.1 F (36.7 C)     Temp src --      SpO2 12/09/20 0933 100 %     Weight --      Height --      Head Circumference --      Peak Flow --      Pain Score 12/09/20 0931 7     Pain Loc --      Pain Edu? --      Excl. in [place]? --    No data found.  Updated Vital Signs BP 128/84   Pulse (!) 58   Temp 98.1 F (36.7 C)   Resp 16   SpO2 100%   Visual Acuity Right Eye Distance:   Left Eye Distance:   Bilateral Distance:    Right Eye Near:   Left Eye Near:    Bilateral Near:     Physical Exam Constitutional:      General: She is not in acute distress.    Appearance: Normal appearance. She is not toxic-appearing.  HENT:     Head: Normocephalic.  Cardiovascular:     Rate and Rhythm: Normal rate and regular rhythm.     Pulses: Normal pulses.  Pulmonary:     Effort: Pulmonary effort is normal.  Abdominal:     General: Abdomen is flat.     Palpations: Abdomen is soft.     Tenderness: There is abdominal tenderness (suprapubic area). There is right CVA tenderness. There is no left CVA tenderness.  Skin:    General: Skin is warm and dry.     Capillary Refill: Capillary refill takes less than 2 seconds.  Neurological:     General: No focal deficit present.      Mental Status: She is alert and oriented to person, place, and time.     UC Treatments / Results  Labs (all labs ordered  are listed, but only abnormal results are displayed) Labs Reviewed  URINALYSIS, COMPLETE (UACMP) WITH MICROSCOPIC - Abnormal; Notable for the following components:      Result Value   Specific Gravity, Urine >1.030 (*)    Ketones, ur TRACE (*)    All other components within normal limits  CBC WITH DIFFERENTIAL/PLATELET  MAGNESIUM  PHOSPHORUS  BASIC METABOLIC PANEL    EKG   Radiology No results found.  Procedures Procedures (including critical care time)  Medications Ordered in UC Medications - No data to display  Initial Impression / Assessment and Plan / UC Course  I have reviewed the triage vital signs and the nursing notes.  Pertinent labs & imaging results that were available during my care of the patient were reviewed by me and considered in my medical decision making (see chart for details).    Patient with urinary symptoms as well as nausea, headache, and fatigue.  She does follow with oncology for breast cancer.  She did report fatigue back in June with a normal CBC and CMP.  Urine is pending.  Given her amount of fatigue, we will also check a BMP, mag, phos, and a CBC.  -Urine with positive ketones but no signs concerning for infection.  Have her push fluids.  We will contact her with results of her chemistry and CBC.  Have her follow-up with primary care and her oncology for her ongoing symptoms.  Given flank pain, will go and send urine for culture.  Final Clinical Impressions(s) / UC Diagnoses   Final diagnoses:  Fatigue, unspecified type  Urinary frequency  Ketonuria     Discharge Instructions      -Urine test was not significant for infection but did note positive ketones that could be related to decreased levels of fluid in the body. -Other labs are pending, can contact you with results. -Would follow-up with primary care as  well as keep your appointment with oncology. -recommend pushing fluids     ED Prescriptions     Medication Sig Dispense Auth. Provider   ondansetron (ZOFRAN ODT) 8 MG disintegrating tablet Take 1 tablet (8 mg total) by mouth every 8 (eight) hours as needed for nausea or vomiting. 20 tablet Luvenia Redden, PA-C      PDMP not reviewed this encounter.   Luvenia Redden, PA-C 12/09/20 1039    Luvenia Redden, PA-C 12/09/20 1044

## 2020-12-09 NOTE — Discharge Instructions (Addendum)
-  Urine test was not significant for infection but did note positive ketones that could be related to decreased levels of fluid in the body. Blood cell counts are normal -Other labs are pending, can contact you with results. -Would follow-up with primary care as well as keep your appointment with oncology. -recommend pushing fluids -Zofran: 1 tablet placed under the tongue and allow to dissolve every 8 hours as needed for nausea

## 2020-12-09 NOTE — ED Triage Notes (Signed)
PT reports back pain "over kidneys" and her urine smells sweet.   Reports fatigue and back pain for 1.5 weeks.  Reports frequency, but not urinating much volume.   PT did an online visit 3-4 weeks ago and took abx for 1 week.

## 2021-05-08 ENCOUNTER — Emergency Department
Admission: EM | Admit: 2021-05-08 | Discharge: 2021-05-08 | Disposition: A | Discharge status: Home / Self Care | Payer: BC Managed Care – PPO | Attending: Emergency Medicine | Admitting: Emergency Medicine

## 2021-05-08 ENCOUNTER — Encounter: Payer: Self-pay | Admitting: Emergency Medicine

## 2021-05-08 ENCOUNTER — Emergency Department: Payer: BC Managed Care – PPO

## 2021-05-08 ENCOUNTER — Other Ambulatory Visit: Payer: Self-pay

## 2021-05-08 DIAGNOSIS — R079 Chest pain, unspecified: Secondary | ICD-10-CM | POA: Diagnosis present

## 2021-05-08 DIAGNOSIS — R1011 Right upper quadrant pain: Secondary | ICD-10-CM | POA: Insufficient documentation

## 2021-05-08 DIAGNOSIS — Z28310 Unvaccinated for covid-19: Secondary | ICD-10-CM | POA: Diagnosis not present

## 2021-05-08 DIAGNOSIS — J45909 Unspecified asthma, uncomplicated: Secondary | ICD-10-CM | POA: Insufficient documentation

## 2021-05-08 DIAGNOSIS — U071 COVID-19: Secondary | ICD-10-CM | POA: Diagnosis not present

## 2021-05-08 DIAGNOSIS — I10 Essential (primary) hypertension: Secondary | ICD-10-CM | POA: Insufficient documentation

## 2021-05-08 DIAGNOSIS — Z853 Personal history of malignant neoplasm of breast: Secondary | ICD-10-CM | POA: Diagnosis not present

## 2021-05-08 DIAGNOSIS — E039 Hypothyroidism, unspecified: Secondary | ICD-10-CM | POA: Insufficient documentation

## 2021-05-08 LAB — HEPATIC FUNCTION PANEL
ALT: 21 U/L (ref 0–44)
AST: 18 U/L (ref 15–41)
Albumin: 4.3 g/dL (ref 3.5–5.0)
Alkaline Phosphatase: 61 U/L (ref 38–126)
Bilirubin, Direct: <0.1 mg/dL (ref 0.0–0.2)
Total Bilirubin: 0.5 mg/dL (ref 0.3–1.2)
Total Protein: 7.4 g/dL (ref 6.5–8.1)

## 2021-05-08 LAB — RESP PANEL BY RT-PCR (FLU A&B, COVID) ARPGX2
Influenza A by PCR: NEGATIVE
Influenza B by PCR: NEGATIVE
SARS Coronavirus 2 by RT PCR: POSITIVE — AB

## 2021-05-08 LAB — URINALYSIS, COMPLETE (UACMP) WITH MICROSCOPIC
Bacteria, UA: NONE SEEN
Bilirubin Urine: NEGATIVE
Glucose, UA: NEGATIVE mg/dL
Ketones, ur: NEGATIVE mg/dL
Leukocytes,Ua: NEGATIVE
Nitrite: NEGATIVE
Protein, ur: NEGATIVE mg/dL
Specific Gravity, Urine: 1.019 (ref 1.005–1.030)
pH: 5 (ref 5.0–8.0)

## 2021-05-08 LAB — CBC
HCT: 42.7 % (ref 36.0–46.0)
Hemoglobin: 14 g/dL (ref 12.0–15.0)
MCH: 29.2 pg (ref 26.0–34.0)
MCHC: 32.8 g/dL (ref 30.0–36.0)
MCV: 89 fL (ref 80.0–100.0)
Platelets: 182 10*3/uL (ref 150–400)
RBC: 4.8 MIL/uL (ref 3.87–5.11)
RDW: 12.1 % (ref 11.5–15.5)
WBC: 5.3 10*3/uL (ref 4.0–10.5)
nRBC: 0 % (ref 0.0–0.2)

## 2021-05-08 LAB — BASIC METABOLIC PANEL
Anion gap: 7 (ref 5–15)
BUN: 11 mg/dL (ref 6–20)
CO2: 26 mmol/L (ref 22–32)
Calcium: 9.4 mg/dL (ref 8.9–10.3)
Chloride: 102 mmol/L (ref 98–111)
Creatinine, Ser: 0.56 mg/dL (ref 0.44–1.00)
GFR, Estimated: >60 mL/min (ref >60)
Glucose, Bld: 101 mg/dL — ABNORMAL HIGH (ref 70–99)
Potassium: 3.7 mmol/L (ref 3.5–5.1)
Sodium: 135 mmol/L (ref 135–145)

## 2021-05-08 LAB — LACTIC ACID, PLASMA: Lactic Acid, Venous: 1 mmol/L (ref 0.5–1.9)

## 2021-05-08 LAB — TROPONIN I (HIGH SENSITIVITY)
Troponin I (High Sensitivity): 3 ng/L (ref <18)
Troponin I (High Sensitivity): 3 ng/L (ref <18)

## 2021-05-08 LAB — LIPASE, BLOOD: Lipase: 28 U/L (ref 11–51)

## 2021-05-08 LAB — PROCALCITONIN: Procalcitonin: <0.1 ng/mL

## 2021-05-08 MED ORDER — DIPHENHYDRAMINE HCL 50 MG/ML IJ SOLN: 50.0000 mg | Freq: Once | INTRAMUSCULAR | Status: DC

## 2021-05-08 MED ORDER — KETOROLAC TROMETHAMINE 30 MG/ML IJ SOLN
30.0000 mg | Freq: Once | INTRAMUSCULAR | Status: AC
  Administered 2021-05-08: 30 mg via INTRAVENOUS
  Filled 2021-05-08: qty 1

## 2021-05-08 MED ORDER — METHYLPREDNISOLONE SODIUM SUCC 125 MG IJ SOLR: 125.0000 mg | Freq: Once | INTRAMUSCULAR | Status: DC

## 2021-05-08 MED ORDER — ONDANSETRON HCL 4 MG/2ML IJ SOLN
4.0000 mg | Freq: Once | INTRAMUSCULAR | Status: AC
  Administered 2021-05-08: 4 mg via INTRAVENOUS
  Filled 2021-05-08: qty 2

## 2021-05-08 MED ORDER — MORPHINE SULFATE (PF) 4 MG/ML IV SOLN
4.0000 mg | Freq: Once | INTRAVENOUS | Status: AC
  Administered 2021-05-08: 4 mg via INTRAVENOUS
  Filled 2021-05-08: qty 1

## 2021-05-08 MED ORDER — DIPHENHYDRAMINE HCL 25 MG PO CAPS: 50.0000 mg | ORAL_CAPSULE | Freq: Once | ORAL | Status: DC

## 2021-05-08 MED ORDER — HYDROCORTISONE SOD SUC (PF) 250 MG IJ SOLR
200.0000 mg | Freq: Once | INTRAMUSCULAR | Status: DC
  Filled 2021-05-08: qty 200

## 2021-05-08 MED ORDER — ACETAMINOPHEN 325 MG PO TABS
650.0000 mg | ORAL_TABLET | Freq: Once | ORAL | Status: AC
  Administered 2021-05-08: 650 mg via ORAL
  Filled 2021-05-08: qty 2

## 2021-05-08 MED ORDER — NIRMATRELVIR/RITONAVIR (PAXLOVID)TABLET: 3.0000 | ORAL_TABLET | Freq: Two times a day (BID) | ORAL | 0 refills | Status: AC

## 2021-05-08 NOTE — ED Provider Notes (Signed)
? ?[facility] ?Provider Note ? ? ? Event Date/Time  ? First MD Initiated Contact with Patient 05/08/21 1514   ?  (approximate) ? ? ?History  ? ?Chest Pain ? ? ?HPI ? ?Caroline Lamb is a 61 y.o. female with history of bilateral mastectomy due to HER2 positive breast cancer, history of radiation and chemotherapy 2 years ago due to breast cancer, hypothyroidism, hyperlipidemia, hypertension, asthma presents with chest pain.  She has multiple complaints including that the left side of her chest hurts and the pain radiates to her back and left shoulder, some abdominal pain, nausea fever that has been ongoing for 2 weeks.  Patient had been diagnosed with a UTI and finished her antibiotic close to 1 week ago.  Unsure if that cleared up all of the infection.  Denies any vomiting although she does feel nausea, no diarrhea. ? ?  ? ? ?Physical Exam  ? ?Triage Vital Signs: ?ED Triage Vitals  ?Enc Vitals Group  ?   BP 05/08/21 1328 129/69  ?   Pulse Rate 05/08/21 1328 97  ?   Resp 05/08/21 1328 18  ?   Temp 05/08/21 1328 (!) 100.4 ?F (38 ?C)  ?   Temp Source 05/08/21 1328 Oral  ?   SpO2 05/08/21 1328 95 %  ?   Weight 05/08/21 1329 165 lb (74.8 kg)  ?   Height 05/08/21 1329 $RemoveBefor'4\' 11"'RnQlElCAWpCs$  (1.499 m)  ?   Head Circumference --   ?   Peak Flow --   ?   Pain Score 05/08/21 1329 8  ?   Pain Loc --   ?   Pain Edu? --   ?   Excl. in [place]? --   ? ? ?Most recent vital signs: ?Vitals:  ? 05/08/21 1600 05/08/21 1628  ?BP: 126/82   ?Pulse: 78 76  ?Resp: 17 (!) 24  ?Temp:  98.1 ?F (36.7 ?C)  ?SpO2: 96% 97%  ? ? ? ?General: Awake, no distress.   ?CV:  Good peripheral perfusion. regular rate and  rhythm ?Resp:  Normal effort. Lungs CTA ?Abd:  No distention.  Nontender ?Other:    ? ? ?ED Results / Procedures / Treatments  ? ?Labs ?(all labs ordered are listed, but only abnormal results are displayed) ?Labs Reviewed  ?RESP PANEL BY RT-PCR (FLU A&B, COVID) ARPGX2 - Abnormal; Notable for the following components:  ?    Result Value  ?  SARS Coronavirus 2 by RT PCR POSITIVE (*)   ? All other components within normal limits  ?BASIC METABOLIC PANEL - Abnormal; Notable for the following components:  ? Glucose, Bld 101 (*)   ? All other components within normal limits  ?URINALYSIS, COMPLETE (UACMP) WITH MICROSCOPIC - Abnormal; Notable for the following components:  ? Color, Urine YELLOW (*)   ? APPearance CLEAR (*)   ? Hgb urine dipstick SMALL (*)   ? All other components within normal limits  ?CULTURE, BLOOD (ROUTINE X 2)  ?CULTURE, BLOOD (ROUTINE X 2)  ?CBC  ?LIPASE, BLOOD  ?LACTIC ACID, PLASMA  ?HEPATIC FUNCTION PANEL  ?PROCALCITONIN  ?PROCALCITONIN  ?TROPONIN I (HIGH SENSITIVITY)  ?TROPONIN I (HIGH SENSITIVITY)  ? ? ? ?EKG ? ?EKG ? ? ?RADIOLOGY ?Chest x-ray, ultrasound right upper quadrant ? ? ? ?PROCEDURES: ? ? ?Procedures ? ? ?MEDICATIONS ORDERED IN ED: ?Medications  ?hydrocortisone sodium succinate (SOLU-CORTEF) injection 200 mg (has no administration in time range)  ?diphenhydrAMINE (BENADRYL) capsule 50 mg (has no administration in time  range)  ?  Or  ?diphenhydrAMINE (BENADRYL) injection 50 mg (has no administration in time range)  ?ketorolac (TORADOL) 30 MG/ML injection 30 mg (has no administration in time range)  ?acetaminophen (TYLENOL) tablet 650 mg (650 mg Oral Given 05/08/21 1410)  ?morphine (PF) 4 MG/ML injection 4 mg (4 mg Intravenous Given 05/08/21 1625)  ?ondansetron (ZOFRAN) injection 4 mg (4 mg Intravenous Given 05/08/21 1625)  ? ? ? ?IMPRESSION / MDM / ASSESSMENT AND PLAN / ED COURSE  ?I reviewed the triage vital signs and the nursing notes. ?             ?               ? ?Differential diagnosis includes, but is not limited to, MI, NSTEMI, acute cholecystitis, pancreatitis, pyelonephritis, continued UTI ? ?Labs are reassuring, CBC is normal, lactic acid is normal indicating she is not septic, troponin is normal, will still get second troponin, lipase is normal indicating that she does not have pancreatitis, basic metabolic panel is  normal ?Due to the chest pain added a hepatic panel and UA for infection. ? ?Chest x-ray was independently reviewed by me and does not show any infection, confirmed by radiology ? ?EKG shows normal sinus rhythm, see physician right ? ?Ultrasound of the abdomen right upper quadrant was independently reviewed by me and I do not see any acute abnormality.  Read as negative by radiologist ? ?Respiratory panel is positive for COVID, hepatic function panel is normal, procalcitonin baseline is normal ? ?Do not feel the patient has sepsis or a bacterial infection.  Think all of her symptoms are coming from [place].  Patient is not vaccinated for COVID so we will place her on Paxlovid.  She is to follow-up with her regular doctor if not improved in 2 to 3 days.  Strict instructions to return if worsening.  Patient is in agreement with treatment plan.  She is discharged stable condition. ? ? ? ? ?  ? ? ?FINAL CLINICAL IMPRESSION(S) / ED DIAGNOSES  ? ?Final diagnoses:  ?RUQ pain  ?COVID-19  ? ? ? ?Rx / DC Orders  ? ?ED Discharge Orders   ? ?      Ordered  ?  nirmatrelvir/ritonavir EUA (PAXLOVID) 20 x 150 MG & 10 x 100MG TABS  2 times daily       ? 05/08/21 1742  ? ?  ?  ? ?  ? ? ? ?Note:  This document was prepared using Dragon voice recognition software and may include unintentional dictation errors. ? ?  ?Versie Starks, PA-C ?05/08/21 1805 ? ?  ?Vanessa [place], MD ?05/08/21 2349 ? ?

## 2021-05-08 NOTE — ED Notes (Signed)
D/C and new RX discussed with pt. Pt also educated on quarantine status. NAD noted. Pt ambulatory on D/C.  ?

## 2021-05-08 NOTE — ED Triage Notes (Signed)
Pt via POV from home. Pt comes with multiple complaints including L sided chest pain that radiates to her back, abd pain, nausea, fever that has been going on for the past 2 weeks. Denies any cough/congestion. Pt is A&Ox4 and NAD.  ?

## 2021-05-08 NOTE — Discharge Instructions (Signed)

## 2021-05-08 NOTE — ED Notes (Signed)
US at bedside

## 2021-05-13 LAB — CULTURE, BLOOD (ROUTINE X 2)
Culture: NO GROWTH
Culture: NO GROWTH

## 2021-07-07 ENCOUNTER — Encounter: Payer: Self-pay | Admitting: Gastroenterology

## 2021-07-08 ENCOUNTER — Encounter: Payer: Self-pay | Admitting: Gastroenterology

## 2021-07-08 NOTE — H&P (Signed)
? ?Pre-Procedure H&P ?  ?Patient ID: Caroline Lamb is a 61 y.o. female. ? ?Gastroenterology Provider: Annamaria Helling, DO ? ?PCP: Sallee Lange, NP ? ?Date: 07/10/2021 ? ?HPI ?[name] is a 61 y.o. female who presents today for Esophagogastroduodenoscopy and Colonoscopy for Chronic abdominal pain, nausea, diarrhea, fatigue; screening for colon cancer. ?Patient with a history of breast cancer status postmastectomy now on Aromasin presenting for above symptoms. ? ?Lower abdominal pain with cramping started in November.  This occurs several times a week but not daily.  She notes that the pain is fleeting lasting only a few seconds accompanied by nausea without vomiting.  No melena or hematochezia. ?She has alternating diarrhea and constipation with bloating.  No known alleviating or exacerbating factors.  She has previously been prescribed Pamelor and Bentyl, but has not been taking these. ?Notes reflux symptoms that are helped by Tums but not completely alleviated.  She denies dysphagia and odynophagia.  Has noted decreased appetite but no weight loss. ?Complains of overall body aches ? ?H. pylori stool was positive and she completed quadruple treatment.  ?Serum celiac studies negative.  Normal LFTs and TSH.  Hemoglobin 14 MCV 89 platelets 282,000 creatinine 0.5. ? ? ?Colonoscopy last performed in 2016 which was reportedly normal. ?No history of EGD ?Father with history of colon polyps.  No family history of colorectal cancer ? ?Past Medical History:  ?Diagnosis Date  ? Anxiety   ? Arthritis   ? uses [place]  ? Asthma   ? controlled  ? breast   ? Hyperlipidemia   ? Hypertension   ? not on medication  ? Hypothyroidism   ? ? ?Past Surgical History:  ?Procedure Laterality Date  ? cesarean    ? x 2  ? COLONOSCOPY WITH PROPOFOL N/A 02/07/2015  ? Procedure: COLONOSCOPY WITH PROPOFOL;  Surgeon: Hulen Luster, MD;  Location: [facility] ENDOSCOPY;  Service: Gastroenterology;  Laterality: N/A;  ? DIAGNOSTIC  LAPAROSCOPY    ? MASS EXCISION Bilateral 12/11/2019  ? Procedure: EXCISION SOFT TISSUE ANKLE RIGHT AND LEFT;  Surgeon: Samara Deist, DPM;  Location: [facility] ORS;  Service: Podiatry;  Laterality: Bilateral;  ? TENDON REPAIR Bilateral 12/11/2019  ? Procedure: FLEXOR TENDON REPAIR;  Surgeon: Samara Deist, DPM;  Location: [facility] ORS;  Service: Podiatry;  Laterality: Bilateral;  ? TONSILLECTOMY    ? ? ?Family History ?No h/o GI disease or malignancy ? ?Review of Systems  ?Constitutional:  Positive for appetite change and fatigue. Negative for activity change, chills, diaphoresis, fever and unexpected weight change.  ?HENT:  Negative for trouble swallowing and voice change.   ?Respiratory:  Negative for shortness of breath and wheezing.   ?Cardiovascular:  Negative for chest pain, palpitations and leg swelling.  ?Gastrointestinal:  Positive for abdominal distention, abdominal pain, constipation, diarrhea and nausea. Negative for anal bleeding, blood in stool, rectal pain and vomiting.  ?Musculoskeletal:  Positive for arthralgias and myalgias.  ?Skin:  Negative for color change and pallor.  ?Neurological:  Negative for dizziness, syncope and weakness.  ?Psychiatric/Behavioral:  Negative for agitation and confusion.   ?All other systems reviewed and are negative.  ? ?Medications ?No current facility-administered medications on file prior to encounter.  ? ?Current Outpatient Medications on File Prior to Encounter  ?Medication Sig Dispense Refill  ? Cholecalciferol (VITAMIN D3 PO) Take 2,000 Units by mouth daily.    ? dicyclomine (BENTYL) 10 MG capsule Take 10 mg by mouth 4 (four) times daily -  before meals and  at bedtime.    ? nortriptyline (PAMELOR) 25 MG capsule Take 25 mg by mouth at bedtime.    ? omeprazole (PRILOSEC) 40 MG capsule Take 40 mg by mouth daily.    ? acetaminophen (TYLENOL) 500 MG tablet Take 500-1,000 mg by mouth every 6 (six) hours as needed for mild pain.    ? diclofenac Sodium (VOLTAREN) 1 % GEL Apply 2  g topically 4 (four) times daily as needed for pain.    ? exemestane (AROMASIN) 25 MG tablet Take 25 mg by mouth at bedtime.     ? levothyroxine (SYNTHROID) 100 MCG tablet Take 100 mcg by mouth daily before breakfast.     ? liothyronine (CYTOMEL) 5 MCG tablet Take 5 mcg by mouth daily before breakfast.    ? Polyethyl Glycol-Propyl Glycol (LUBRICANT EYE DROPS) 0.4-0.3 % SOLN Place 1 drop into both eyes 3 (three) times daily as needed (dry/irritated eyes.).    ? ? ?Pertinent medications related to GI and procedure were reviewed by me with the patient prior to the procedure ? ? ?Current Facility-Administered Medications:  ?  0.9 %  sodium chloride infusion, , Intravenous, Continuous, Annamaria Helling, DO, Last Rate: 20 mL/hr at 07/10/21 0942, New Bag at 07/10/21 0762 ?  ?  ? ?Allergies  ?Allergen Reactions  ? Contrast Media [Iodinated Contrast Media] Other (See Comments)  ?  sneezing  ? ?Allergies were reviewed by me prior to the procedure ? ?Objective  ? ?Body mass index is 33.33 kg/m?. ?Vitals:  ? 07/10/21 0925  ?BP: 132/89  ?Pulse: 96  ?Resp: 16  ?Temp: (!) 97.2 ?F (36.2 ?C)  ?TempSrc: Temporal  ?SpO2: 100%  ?Weight: 74.8 kg  ?Height: '4\' 11"'$  (1.499 m)  ? ? ? ?Physical Exam ?Vitals and nursing note reviewed.  ?Constitutional:   ?   General: She is not in acute distress. ?   Appearance: Normal appearance. She is not ill-appearing, toxic-appearing or diaphoretic.  ?HENT:  ?   Head: Normocephalic and atraumatic.  ?   Nose: Nose normal.  ?   Mouth/Throat:  ?   Mouth: Mucous membranes are moist.  ?   Pharynx: Oropharynx is clear.  ?Eyes:  ?   General: No scleral icterus. ?   Extraocular Movements: Extraocular movements intact.  ?Cardiovascular:  ?   Rate and Rhythm: Normal rate and regular rhythm.  ?   Heart sounds: Normal heart sounds. No murmur heard. ?  No friction rub. No gallop.  ?Pulmonary:  ?   Effort: Pulmonary effort is normal. No respiratory distress.  ?   Breath sounds: Normal breath sounds. No wheezing,  rhonchi or rales.  ?Abdominal:  ?   General: Bowel sounds are normal. There is no distension.  ?   Palpations: Abdomen is soft.  ?   Tenderness: There is no abdominal tenderness. There is no guarding or rebound.  ?Musculoskeletal:  ?   Cervical back: Neck supple.  ?   Right lower leg: No edema.  ?   Left lower leg: No edema.  ?Skin: ?   General: Skin is warm and dry.  ?   Coloration: Skin is not jaundiced or pale.  ?Neurological:  ?   General: No focal deficit present.  ?   Mental Status: She is alert and oriented to person, place, and time. Mental status is at baseline.  ?Psychiatric:     ?   Mood and Affect: Mood normal.     ?   Behavior: Behavior normal.     ?  Thought Content: Thought content normal.     ?   Judgment: Judgment normal.  ? ? ? ?Assessment:  ?[name] is a 61 y.o. female  who presents today for Esophagogastroduodenoscopy and Colonoscopy for Chronic abdominal pain, nausea, diarrhea, fatigue; screening for colon cancer. ? ?Plan:  ?Esophagogastroduodenoscopy and Colonoscopy with possible intervention today ? ?Esophagogastroduodenoscopy and Colonoscopy with possible biopsy, control of bleeding, polypectomy, and interventions as necessary has been discussed with the patient/patient representative. Informed consent was obtained from the patient/patient representative after explaining the indication, nature, and risks of the procedure including but not limited to death, bleeding, perforation, missed neoplasm/lesions, cardiorespiratory compromise, and reaction to medications. Opportunity for questions was given and appropriate answers were provided. Patient/patient representative has verbalized understanding is amenable to undergoing the procedure. ? ? ?Annamaria Helling, DO  ?[facility] ? ?Portions of the record may have been created with voice recognition software. Occasional wrong-word or 'sound-a-like' substitutions may have occurred due to the inherent limitations  of voice recognition software.  Read the chart carefully and recognize, using context, where substitutions may have occurred. ?

## 2021-07-10 ENCOUNTER — Encounter
Admission: RE | Disposition: A | Discharge status: Home / Self Care | Payer: Self-pay | Source: Home / Self Care | Attending: Gastroenterology

## 2021-07-10 ENCOUNTER — Ambulatory Visit
Admission: RE | Admit: 2021-07-10 | Discharge: 2021-07-10 | Disposition: A | Discharge status: Home / Self Care | Payer: BC Managed Care – PPO | Attending: Gastroenterology | Admitting: Gastroenterology

## 2021-07-10 ENCOUNTER — Encounter: Payer: Self-pay | Admitting: Gastroenterology

## 2021-07-10 ENCOUNTER — Ambulatory Visit: Payer: BC Managed Care – PPO | Admitting: Certified Registered Nurse Anesthetist

## 2021-07-10 DIAGNOSIS — Z1211 Encounter for screening for malignant neoplasm of colon: Secondary | ICD-10-CM | POA: Insufficient documentation

## 2021-07-10 DIAGNOSIS — K2970 Gastritis, unspecified, without bleeding: Secondary | ICD-10-CM | POA: Diagnosis not present

## 2021-07-10 DIAGNOSIS — K2980 Duodenitis without bleeding: Secondary | ICD-10-CM | POA: Insufficient documentation

## 2021-07-10 DIAGNOSIS — K3189 Other diseases of stomach and duodenum: Secondary | ICD-10-CM | POA: Insufficient documentation

## 2021-07-10 DIAGNOSIS — R5383 Other fatigue: Secondary | ICD-10-CM | POA: Insufficient documentation

## 2021-07-10 DIAGNOSIS — K640 First degree hemorrhoids: Secondary | ICD-10-CM | POA: Insufficient documentation

## 2021-07-10 DIAGNOSIS — I10 Essential (primary) hypertension: Secondary | ICD-10-CM | POA: Insufficient documentation

## 2021-07-10 DIAGNOSIS — G8929 Other chronic pain: Secondary | ICD-10-CM | POA: Insufficient documentation

## 2021-07-10 DIAGNOSIS — E039 Hypothyroidism, unspecified: Secondary | ICD-10-CM | POA: Insufficient documentation

## 2021-07-10 DIAGNOSIS — R1013 Epigastric pain: Secondary | ICD-10-CM | POA: Diagnosis present

## 2021-07-10 DIAGNOSIS — R197 Diarrhea, unspecified: Secondary | ICD-10-CM | POA: Diagnosis not present

## 2021-07-10 DIAGNOSIS — B9681 Helicobacter pylori [H. pylori] as the cause of diseases classified elsewhere: Secondary | ICD-10-CM | POA: Diagnosis not present

## 2021-07-10 HISTORY — PX: ESOPHAGOGASTRODUODENOSCOPY: SHX5428

## 2021-07-10 HISTORY — PX: COLONOSCOPY WITH PROPOFOL: SHX5780

## 2021-07-10 SURGERY — COLONOSCOPY WITH PROPOFOL
Anesthesia: General

## 2021-07-10 MED ORDER — GLYCOPYRROLATE 0.2 MG/ML IJ SOLN
INTRAMUSCULAR | Status: DC | PRN
Start: 2021-07-10 — End: 2021-07-10
  Administered 2021-07-10: .2 mg via INTRAVENOUS

## 2021-07-10 MED ORDER — SODIUM CHLORIDE 0.9 % IV SOLN: INTRAVENOUS | Status: DC

## 2021-07-10 MED ORDER — LIDOCAINE HCL (CARDIAC) PF 100 MG/5ML IV SOSY
PREFILLED_SYRINGE | INTRAVENOUS | Status: DC | PRN
Start: 2021-07-10 — End: 2021-07-10
  Administered 2021-07-10: 100 mg via INTRAVENOUS

## 2021-07-10 MED ORDER — PROPOFOL 10 MG/ML IV BOLUS
INTRAVENOUS | Status: DC | PRN
  Administered 2021-07-10 (×2): 20 mg via INTRAVENOUS
  Administered 2021-07-10: 80 mg via INTRAVENOUS
  Administered 2021-07-10 (×2): 20 mg via INTRAVENOUS

## 2021-07-10 MED ORDER — PROPOFOL 500 MG/50ML IV EMUL
INTRAVENOUS | Status: DC | PRN
  Administered 2021-07-10: 150 ug/kg/min via INTRAVENOUS

## 2021-07-10 NOTE — Op Note (Signed)
[facility] ?Gastroenterology ?Patient Name: Caroline Lamb ?Procedure Date: 07/10/2021 9:55 AM ?MRN: [id number] ?Account #: [id number] ?Date of Birth: 1960/12/23 ?Admit Type: Outpatient ?Age: 61 ?Room: [facility] ENDO ROOM 2 ?Gender: Female ?Note Status: Finalized ?Instrument Name: Colonoscope [id number] ?Procedure:             Colonoscopy ?Indications:           Screening for colorectal malignant neoplasm ?Providers:             Annamaria Helling DO, DO ?Referring MD:          Juluis Rainier (Referring MD) ?Medicines:             Monitored Anesthesia Care ?Complications:         No immediate complications. Estimated blood loss: None. ?Procedure:             Pre-Anesthesia Assessment: ?                       - Prior to the procedure, a History and Physical was  ?                       performed, and patient medications and allergies were  ?                       reviewed. The patient is competent. The risks and  ?                       benefits of the procedure and the sedation options and  ?                       risks were discussed with the patient. All questions  ?                       were answered and informed consent was obtained.  ?                       Patient identification and proposed procedure were  ?                       verified by the physician, the nurse, the anesthetist  ?                       and the technician in the endoscopy suite. Mental  ?                       Status Examination: alert and oriented. Airway  ?                       Examination: normal oropharyngeal airway and neck  ?                       mobility. Respiratory Examination: clear to  ?                       auscultation. CV Examination: RRR, no murmurs, no S3  ?                       or S4. Prophylactic Antibiotics: The patient does not  ?  require prophylactic antibiotics. Prior  ?                       Anticoagulants: The patient has taken no previous  ?                       anticoagulant  or antiplatelet agents. ASA Grade  ?                       Assessment: II - A patient with mild systemic disease.  ?                       After reviewing the risks and benefits, the patient  ?                       was deemed in satisfactory condition to undergo the  ?                       procedure. The anesthesia plan was to use monitored  ?                       anesthesia care (MAC). Immediately prior to  ?                       administration of medications, the patient was  ?                       re-assessed for adequacy to receive sedatives. The  ?                       heart rate, respiratory rate, oxygen saturations,  ?                       blood pressure, adequacy of pulmonary ventilation, and  ?                       response to care were monitored throughout the  ?                       procedure. The physical status of the patient was  ?                       re-assessed after the procedure. ?                       After obtaining informed consent, the colonoscope was  ?                       passed under direct vision. Throughout the procedure,  ?                       the patient's blood pressure, pulse, and oxygen  ?                       saturations were monitored continuously. The  ?                       Colonoscope was introduced through the anus and  ?  advanced to the the terminal ileum, with  ?                       identification of the appendiceal orifice and IC  ?                       valve. The colonoscopy was performed without  ?                       difficulty. The patient tolerated the procedure well.  ?                       The quality of the bowel preparation was evaluated  ?                       using the BBPS [facility] Bowel Preparation Scale) with  ?                       scores of: Right Colon = 3, Transverse Colon = 3 and  ?                       Left Colon = 3 (entire mucosa seen well with no  ?                       residual staining, small fragments  of stool or opaque  ?                       liquid). The total BBPS score equals 9. The terminal  ?                       ileum, ileocecal valve, appendiceal orifice, and  ?                       rectum were photographed. ?Findings: ?     The perianal and digital rectal examinations were normal. Pertinent  ?     negatives include normal sphincter tone. ?     The terminal ileum appeared normal. Estimated blood loss: none. ?     Non-bleeding internal hemorrhoids were found during retroflexion. The  ?     hemorrhoids were Grade I (internal hemorrhoids that do not prolapse).  ?     Estimated blood loss: none. ?     The entire examined colon appeared normal on direct and retroflexion  ?     views. ?Impression:            - The examined portion of the ileum was normal. ?                       - Non-bleeding internal hemorrhoids. ?                       - The entire examined colon is normal on direct and  ?                       retroflexion views. ?                       - No specimens collected. ?Recommendation:        - Discharge  patient to home. ?                       - Resume previous diet. ?                       - Continue present medications. ?                       - Repeat colonoscopy in 10 years for screening  ?                       purposes. ?                       - 10 years if no family history of colon cancer or  ?                       advanced polyps. If this family history is present  ?                       then 5 year repeat. ?                       - The findings and recommendations were discussed with  ?                       the patient. ?Procedure Code(s):     --- Professional --- ?                       [phone], Colonoscopy, flexible; diagnostic, including  ?                       collection of specimen(s) by brushing or washing, when  ?                       performed (separate procedure) ?Diagnosis Code(s):     --- Professional --- ?                       Z12.11, Encounter for screening for  malignant neoplasm  ?                       of colon ?                       K64.0, First degree hemorrhoids ?CPT copyright 2019 American Medical Association. All rights reserved. ?The codes documented in this report are preliminary and upon coder review may  ?be revised to meet current compliance requirements. ?Attending Participation: ?     I personally performed the entire procedure. ?Volney American, DO ?Annamaria Helling DO, DO ?07/10/2021 10:33:41 AM ?This report has been signed electronically. ?Number of Addenda: 0 ?Note Initiated On: 07/10/2021 9:55 AM ?Scope Withdrawal Time: 0 hours 10 minutes 1 second  ?Total Procedure Duration: 0 hours 14 minutes 38 seconds  ?Estimated Blood Loss:  Estimated blood loss: none. ?     [facility] ?

## 2021-07-10 NOTE — Interval H&P Note (Signed)
History and Physical Interval Note: Preprocedure H&P from 07/10/21 ? was reviewed and there was no interval change after seeing and examining the patient.  Written consent was obtained from the patient after discussion of risks, benefits, and alternatives. Patient has consented to proceed with Esophagogastroduodenoscopy and Colonoscopy with possible intervention ? ? ?07/10/2021 ?9:53 AM ? ?Caroline Lamb  has presented today for surgery, with the diagnosis of SCREENING ?GERD.  The various methods of treatment have been discussed with the patient and family. After consideration of risks, benefits and other options for treatment, the patient has consented to  Procedure(s): ?COLONOSCOPY WITH PROPOFOL (N/A) ?ESOPHAGOGASTRODUODENOSCOPY (EGD) (N/A) as a surgical intervention.  The patient's history has been reviewed, patient examined, no change in status, stable for surgery.  I have reviewed the patient's chart and labs.  Questions were answered to the patient's satisfaction.   ? ? ?Annamaria Helling ? ? ?

## 2021-07-10 NOTE — Op Note (Signed)
[facility] ?Gastroenterology ?Patient Name: Caroline Lamb ?Procedure Date: 07/10/2021 9:56 AM ?MRN: [id number] ?Account #: [id number] ?Date of Birth: [birth year] ?Admit Type: Outpatient ?Age: 61 ?Room: [facility] ENDO ROOM 2 ?Gender: Female ?Note Status: Finalized ?Instrument Name: Upper Endoscope [id number] ?Procedure:             Upper GI endoscopy ?Indications:           Epigastric abdominal pain, Heartburn, Follow-up of  ?                       Helicobacter pylori ?Providers:             Annamaria Helling DO, DO ?Referring MD:          Juluis Rainier (Referring MD) ?Medicines:             Monitored Anesthesia Care ?Complications:         No immediate complications. Estimated blood loss:  ?                       Minimal. ?Procedure:             Pre-Anesthesia Assessment: ?                       - Prior to the procedure, a History and Physical was  ?                       performed, and patient medications and allergies were  ?                       reviewed. The patient is competent. The risks and  ?                       benefits of the procedure and the sedation options and  ?                       risks were discussed with the patient. All questions  ?                       were answered and informed consent was obtained.  ?                       Patient identification and proposed procedure were  ?                       verified by the physician, the nurse, the anesthetist  ?                       and the technician in the endoscopy suite. Mental  ?                       Status Examination: alert and oriented. Airway  ?                       Examination: normal oropharyngeal airway and neck  ?                       mobility. Respiratory Examination: clear to  ?  auscultation. CV Examination: RRR, no murmurs, no S3  ?                       or S4. Prophylactic Antibiotics: The patient does not  ?                       require prophylactic antibiotics. Prior  ?                        Anticoagulants: The patient has taken no previous  ?                       anticoagulant or antiplatelet agents. ASA Grade  ?                       Assessment: II - A patient with mild systemic disease.  ?                       After reviewing the risks and benefits, the patient  ?                       was deemed in satisfactory condition to undergo the  ?                       procedure. The anesthesia plan was to use monitored  ?                       anesthesia care (MAC). Immediately prior to  ?                       administration of medications, the patient was  ?                       re-assessed for adequacy to receive sedatives. The  ?                       heart rate, respiratory rate, oxygen saturations,  ?                       blood pressure, adequacy of pulmonary ventilation, and  ?                       response to care were monitored throughout the  ?                       procedure. The physical status of the patient was  ?                       re-assessed after the procedure. ?                       After obtaining informed consent, the endoscope was  ?                       passed under direct vision. Throughout the procedure,  ?                       the patient's blood pressure, pulse, and oxygen  ?  saturations were monitored continuously. The Endoscope  ?                       was introduced through the mouth, and advanced to the  ?                       second part of duodenum. The upper GI endoscopy was  ?                       accomplished without difficulty. The patient tolerated  ?                       the procedure well. ?Findings: ?     Localized mildly erythematous mucosa without active bleeding and with no  ?     stigmata of bleeding was found in the duodenal bulb. Biopsies for  ?     histology were taken with a cold forceps for evaluation of celiac  ?     disease. Estimated blood loss was minimal. ?     The exam of the duodenum was otherwise normal. ?      Localized mild inflammation characterized by erythema and granularity  ?     was found in the gastric antrum. Biopsies were taken with a cold forceps  ?     for histology. Estimated blood loss was minimal. ?     Normal mucosa was found in the entire examined stomach. Biopsies were  ?     taken with a cold forceps for Helicobacter pylori testing. Estimated  ?     blood loss was minimal. ?     The exam of the stomach was otherwise normal. ?     The Z-line was regular. Estimated blood loss: none. ?     Esophagogastric landmarks were identified: the gastroesophageal junction  ?     was found at 35 cm from the incisors. ?     The exam of the esophagus was otherwise normal. ?     Entired stomach visualized in NBI as well ?Impression:            - Erythematous duodenopathy. Biopsied. ?                       - Gastritis. Biopsied. ?                       - Normal mucosa was found in the entire stomach.  ?                       Biopsied. ?                       - Z-line regular. ?                       - Esophagogastric landmarks identified. ?Recommendation:        - Discharge patient to home. ?                       - Resume previous diet. ?                       - Continue present medications. ?                       -  Await pathology results. ?                       - Return to GI clinic as previously scheduled. ?                       - The findings and recommendations were discussed with  ?                       the patient. ?Procedure Code(s):     --- Professional --- ?                       [id number], Esophagogastroduodenoscopy, flexible,  ?                       transoral; with biopsy, single or multiple ?Diagnosis Code(s):     --- Professional --- ?                       K31.89, Other diseases of stomach and duodenum ?                       K29.70, Gastritis, unspecified, without bleeding ?                       R10.13, Epigastric pain ?                       R12, Heartburn ?                       G92.01, Helicobacter  pylori [H. pylori] as the cause  ?                       of diseases classified elsewhere ?CPT copyright 2019 American Medical Association. All rights reserved. ?The codes documented in this report are preliminary and upon coder review may  ?be revised to meet current compliance requirements. ?Attending Participation: ?     I personally performed the entire procedure. ?Volney American, DO ?Annamaria Helling DO, DO ?07/10/2021 10:10:38 AM ?This report has been signed electronically. ?Number of Addenda: 0 ?Note Initiated On: 07/10/2021 9:56 AM ?Estimated Blood Loss:  Estimated blood loss was minimal. ?     [facility] ?

## 2021-07-10 NOTE — Anesthesia Procedure Notes (Signed)
Date/Time: 07/10/2021 10:01 AM ?Performed by: Lily Peer, Helen Winterhalter, CRNA ?Pre-anesthesia Checklist: Patient identified, Emergency Drugs available, Suction available, Patient being monitored and Timeout performed ?Patient Re-evaluated:Patient Re-evaluated prior to induction ?Oxygen Delivery Method: Simple face mask ?Induction Type: IV induction ? ? ? ? ?

## 2021-07-10 NOTE — Transfer of Care (Signed)
Immediate Anesthesia Transfer of Care Note ? ?Patient: Caroline Lamb ? ?Procedure(s) Performed: COLONOSCOPY WITH PROPOFOL ?ESOPHAGOGASTRODUODENOSCOPY (EGD) ? ?Patient Location: Endoscopy Unit ? ?Anesthesia Type:General ? ?Level of Consciousness: drowsy ? ?Airway & Oxygen Therapy: Patient Spontanous Breathing ? ?Post-op Assessment: Report given to RN and Post -op Vital signs reviewed and stable ? ?Post vital signs: Reviewed and stable ? ?Last Vitals:  ?Vitals Value Taken Time  ?BP 97/42   ?Temp    ?Pulse 85 07/10/21 1032  ?Resp 22 07/10/21 1033  ?SpO2 98 % 07/10/21 1032  ?Vitals shown include unvalidated device data. ? ?Last Pain:  ?Vitals:  ? 07/10/21 0925  ?TempSrc: Temporal  ?PainSc: 0-No pain  ?   ? ?  ? ?Complications: No notable events documented. ?

## 2021-07-10 NOTE — Anesthesia Preprocedure Evaluation (Signed)
Anesthesia Evaluation  ?Patient identified by MRN, date of birth, ID band ?Patient awake ? ? ? ?Reviewed: ?Allergy & Precautions, H&P , NPO status , Patient's Chart, lab work & pertinent test results, reviewed documented beta blocker date and time  ? ?History of Anesthesia Complications ?Negative for: history of anesthetic complications ? ?Airway ?Mallampati: II ? ?TM Distance: >3 FB ?Neck ROM: full ? ? ? Dental ? ?(+) Teeth Intact, Dental Advidsory Given ?Veneers on the top:   ?Pulmonary ?neg shortness of breath, asthma , neg COPD, neg recent URI,  ?  ?Pulmonary exam normal ? ? ? ? ? ? ? Cardiovascular ?Exercise Tolerance: Good ?hypertension, On Medications ?(-) angina(-) Past MI and (-) Cardiac Stents negative cardio ROS ?Normal cardiovascular exam(-) dysrhythmias (-) Valvular Problems/Murmurs ?Rhythm:regular Rate:Normal ? ? ?  ?Neuro/Psych ?Anxiety negative neurological ROS ? negative psych ROS  ? GI/Hepatic ?negative GI ROS, Neg liver ROS,   ?Endo/Other  ?neg diabetesHypothyroidism  ? Renal/GU ?negative Renal ROS  ?negative genitourinary ?  ?Musculoskeletal ? ? Abdominal ?  ?Peds ? Hematology ?negative hematology ROS ?(+)   ?Anesthesia Other Findings ?Past Medical History: ?No date: Anxiety ?No date: Arthritis ?    Comment:  uses Volteran ?No date: Asthma ?    Comment:  controlled ?No date: breast ?No date: Hyperlipidemia ?No date: Hypertension ?    Comment:  not on medication ?No date: Hypothyroidism ?Past Surgical History: ?No date: cesarean ?    Comment:  x 2 ?02/07/2015: COLONOSCOPY WITH PROPOFOL; N/A ?    Comment:  Procedure: COLONOSCOPY WITH PROPOFOL;  Surgeon: Lupita Dawn  ?             Candace Cruise, MD;  Location: [facility] ENDOSCOPY;  Service:  ?             Gastroenterology;  Laterality: N/A; ?No date: DIAGNOSTIC LAPAROSCOPY ?No date: TONSILLECTOMY ? ? Reproductive/Obstetrics ?negative OB ROS ? ?  ? ? ? ? ? ? ? ? ? ? ? ? ? ?  ?  ? ? ? ? ? ? ? ? ?Anesthesia Physical ? ?Anesthesia  Plan ? ?ASA: 2 ? ?Anesthesia Plan: General  ? ?Post-op Pain Management:   ? ?Induction: Intravenous ? ?PONV Risk Score and Plan: 4 or greater and Propofol infusion and TIVA ? ?Airway Management Planned: Natural Airway and Nasal Cannula ? ?Additional Equipment:  ? ?Intra-op Plan:  ? ?Post-operative Plan:  ? ?Informed Consent: I have reviewed the patients History and Physical, chart, labs and discussed the procedure including the risks, benefits and alternatives for the proposed anesthesia with the patient or authorized representative who has indicated his/her understanding and acceptance.  ? ? ? ?Dental Advisory Given ? ?Plan Discussed with: CRNA ? ?Anesthesia Plan Comments:   ? ? ? ? ? ? ?Anesthesia Quick Evaluation ? ?

## 2021-07-11 ENCOUNTER — Encounter: Payer: Self-pay | Admitting: Gastroenterology

## 2021-07-11 LAB — SURGICAL PATHOLOGY

## 2021-07-11 NOTE — Anesthesia Postprocedure Evaluation (Signed)
Anesthesia Post Note ? ?Patient: Caroline Lamb ? ?Procedure(s) Performed: COLONOSCOPY WITH PROPOFOL ?ESOPHAGOGASTRODUODENOSCOPY (EGD) ? ?Patient location during evaluation: Endoscopy ?Anesthesia Type: General ?Level of consciousness: awake and alert ?Pain management: pain level controlled ?Vital Signs Assessment: post-procedure vital signs reviewed and stable ?Respiratory status: spontaneous breathing, nonlabored ventilation, respiratory function stable and patient connected to nasal cannula oxygen ?Cardiovascular status: blood pressure returned to baseline and stable ?Postop Assessment: no apparent nausea or vomiting ?Anesthetic complications: no ? ? ?No notable events documented. ? ? ?Last Vitals:  ?Vitals:  ? 07/10/21 1043 07/10/21 1053  ?BP: (!) 102/50 (!) 110/58  ?Pulse: 71 68  ?Resp: 18 14  ?Temp:    ?SpO2: 97% 99%  ?  ?Last Pain:  ?Vitals:  ? 07/11/21 0744  ?TempSrc:   ?PainSc: 0-No pain  ? ? ?  ?  ?  ?  ?  ?  ? ?Martha Clan ? ? ? ? ?

## 2021-08-01 ENCOUNTER — Encounter: Payer: Self-pay | Admitting: Internal Medicine

## 2021-08-01 ENCOUNTER — Ambulatory Visit (INDEPENDENT_AMBULATORY_CARE_PROVIDER_SITE_OTHER): Payer: BC Managed Care – PPO | Admitting: Internal Medicine

## 2021-08-01 VITALS — BP 122/62 | HR 92 | Temp 97.8°F | Resp 16 | Ht 59.0 in | Wt 167.0 lb

## 2021-08-01 DIAGNOSIS — Z1159 Encounter for screening for other viral diseases: Secondary | ICD-10-CM

## 2021-08-01 DIAGNOSIS — Z853 Personal history of malignant neoplasm of breast: Secondary | ICD-10-CM | POA: Diagnosis not present

## 2021-08-01 DIAGNOSIS — K2100 Gastro-esophageal reflux disease with esophagitis, without bleeding: Secondary | ICD-10-CM | POA: Diagnosis not present

## 2021-08-01 DIAGNOSIS — R233 Spontaneous ecchymoses: Secondary | ICD-10-CM

## 2021-08-01 DIAGNOSIS — R8290 Unspecified abnormal findings in urine: Secondary | ICD-10-CM | POA: Diagnosis not present

## 2021-08-01 DIAGNOSIS — E039 Hypothyroidism, unspecified: Secondary | ICD-10-CM

## 2021-08-01 DIAGNOSIS — R350 Frequency of micturition: Secondary | ICD-10-CM | POA: Diagnosis not present

## 2021-08-01 DIAGNOSIS — E559 Vitamin D deficiency, unspecified: Secondary | ICD-10-CM

## 2021-08-01 DIAGNOSIS — Z13220 Encounter for screening for lipoid disorders: Secondary | ICD-10-CM

## 2021-08-01 LAB — POCT URINALYSIS DIPSTICK
Bilirubin, UA: NEGATIVE
Glucose, UA: NEGATIVE
Ketones, UA: NEGATIVE
Leukocytes, UA: NEGATIVE
Nitrite, UA: NEGATIVE
Protein, UA: NEGATIVE
Spec Grav, UA: 1.015 (ref 1.010–1.025)
Urobilinogen, UA: 0.2 E.U./dL
pH, UA: 6 (ref 5.0–8.0)

## 2021-08-01 NOTE — Assessment & Plan Note (Signed)
Following with Endocrinology but just switched from [place] to the [facility]. Currently on Levothyroxine 100 mcg and Cytomel 5 mcg daily. Plan to recheck thyroid function today.

## 2021-08-01 NOTE — Progress Notes (Signed)
New Patient Office Visit  Subjective    Patient ID: Caroline Lamb, female    DOB: [birth year]  Age: 61 y.o. MRN: [id number]  CC:  Chief Complaint  Patient presents with   Establish Care    labs   Urinary Tract Infection    Frequency, urgency and odor   Referral    GYN    HPI Caroline Lamb presents to establish care.   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 -Following with [facility], note reviewed from 07/06/21 -First diagnosed in 2019  -Currently on adjuvant Aromasin 25 mg  -Does have left upper extremity lymphedema -Stating that she is having post-radiation pain, had seen pain management in the past was put on Nortriptyline that did not work well for her   Hypothyroidism: -Medications: Levothyroxine 100 mcg, Cytomel 5 mcg   -Patient is compliant with the above medication (s) at the above dose and reports no medication side effects.  -Denies weight changes, cold/heat intolerance, skin changes, anxiety/palpitations  -Last TSH: 1.186 6/22 -Follows with Endocrinology at [place]   GERD: -Currently on Prilosec 40 mg daily  -Following with GI for H. Pylori  -EGD and colonoscopy done on 07/10/21 showing gastritis, repeat colonoscopy recommended in 10 years   URINARY SYMPTOMS Dysuria: no Urinary frequency: yes Urgency: no Small volume voids: yes Foul odor: yes Hematuria: no Abdominal pain: no Back pain: no Suprapubic pain/pressure: no Flank pain: yes Fever:  yes with chemo   Health Maintenance: -Blood work due -Pap due   Outpatient Encounter Medications as of 08/01/2021  Medication Sig   acetaminophen (TYLENOL) 500 MG tablet Take 500-1,000 mg by mouth every 6 (six) hours as needed for mild pain.   amoxicillin (AMOXIL) 500 MG capsule Take 1,000 mg by mouth 2 (two) times daily.   Cholecalciferol (VITAMIN D3 PO) Take 2,000  Units by mouth daily.   exemestane (AROMASIN) 25 MG tablet Take 25 mg by mouth at bedtime.    levothyroxine (SYNTHROID) 100 MCG tablet Take 100 mcg by mouth daily before breakfast.    liothyronine (CYTOMEL) 5 MCG tablet Take 5 mcg by mouth daily before breakfast.   omeprazole (PRILOSEC) 40 MG capsule Take 40 mg by mouth daily.   Polyethyl Glycol-Propyl Glycol (LUBRICANT EYE DROPS) 0.4-0.3 % SOLN Place 1 drop into both eyes 3 (three) times daily as needed (dry/irritated eyes.).   [DISCONTINUED] diclofenac Sodium (VOLTAREN) 1 % GEL Apply 2 g topically 4 (four) times daily as needed for pain.   [DISCONTINUED] dicyclomine (BENTYL) 10 MG capsule Take 10 mg by mouth 4 (four) times daily -  before meals and at bedtime.   [DISCONTINUED] nortriptyline (PAMELOR) 25 MG capsule Take 25 mg by mouth at bedtime.   No facility-administered encounter medications on file as of 08/01/2021.    Past Medical History:  Diagnosis Date   Anxiety    Arthritis    uses Volteran   Asthma    controlled   breast    Hyperlipidemia    Hypertension    not on medication   Hypothyroidism     Past Surgical History:  Procedure Laterality Date   cesarean     x 2   CESAREAN SECTION     COLONOSCOPY WITH PROPOFOL N/A 02/07/2015   Procedure: COLONOSCOPY WITH PROPOFOL;  Surgeon: Hulen Luster, MD;  Location: [facility] ENDOSCOPY;  Service: Gastroenterology;  Laterality: N/A;  COLONOSCOPY WITH PROPOFOL N/A 07/10/2021   Procedure: COLONOSCOPY WITH PROPOFOL;  Surgeon: Annamaria Helling, DO;  Location: [facility] ENDOSCOPY;  Service: Gastroenterology;  Laterality: N/A;   DIAGNOSTIC LAPAROSCOPY     ESOPHAGOGASTRODUODENOSCOPY N/A 07/10/2021   Procedure: ESOPHAGOGASTRODUODENOSCOPY (EGD);  Surgeon: Annamaria Helling, DO;  Location: [facility] ENDOSCOPY;  Service: Gastroenterology;  Laterality: N/A;   MASS EXCISION Bilateral 12/11/2019   Procedure: EXCISION SOFT TISSUE ANKLE RIGHT AND LEFT;  Surgeon: Samara Deist, DPM;  Location: [facility] ORS;   Service: Podiatry;  Laterality: Bilateral;   MASTECTOMY Bilateral    TENDON REPAIR Bilateral 12/11/2019   Procedure: FLEXOR TENDON REPAIR;  Surgeon: Samara Deist, DPM;  Location: [facility] ORS;  Service: Podiatry;  Laterality: Bilateral;   TONSILLECTOMY     TUBAL LIGATION      Family History  Problem Relation Age of Onset   Hematuria Father    Prostate cancer Father    Hematuria Mother    Tuberculosis Paternal Grandfather    Kidney cancer Neg Hx    Kidney disease Neg Hx    Sickle cell trait Neg Hx     Social History   Socioeconomic History   Marital status: Married    Spouse name: Not on file   Number of children: Not on file   Years of education: Not on file   Highest education level: Not on file  Occupational History   Not on file  Tobacco Use   Smoking status: Never   Smokeless tobacco: Never  Vaping Use   Vaping Use: Never used  Substance and Sexual Activity   Alcohol use: No    Comment: occassinally   Drug use: No   Sexual activity: Not Currently  Other Topics Concern   Not on file  Social History Narrative   Not on file   Social Determinants of Health   Financial Resource Strain: Not on file  Food Insecurity: Not on file  Transportation Needs: Not on file  Physical Activity: Not on file  Stress: Not on file  Social Connections: Not on file  Intimate Partner Violence: Not on file    Review of Systems  Constitutional:  Negative for chills and fever.  Respiratory:  Negative for shortness of breath.   Cardiovascular:  Negative for chest pain.  Gastrointestinal:  Positive for heartburn and nausea. Negative for abdominal pain and vomiting.  Genitourinary:  Positive for frequency and urgency. Negative for dysuria and hematuria.       Objective    BP 122/62   Pulse 92   Temp 97.8 F (36.6 C)   Resp 16   Ht 4' 11"  (1.499 m)   Wt 167 lb (75.8 kg)   SpO2 99%   BMI 33.73 kg/m   Physical Exam Constitutional:      Appearance: Normal appearance.   HENT:     Head: Normocephalic and atraumatic.     Mouth/Throat:     Mouth: Mucous membranes are moist.     Pharynx: Oropharynx is clear.  Eyes:     Conjunctiva/sclera: Conjunctivae normal.  Cardiovascular:     Rate and Rhythm: Normal rate and regular rhythm.  Pulmonary:     Effort: Pulmonary effort is normal.     Breath sounds: Normal breath sounds.  Musculoskeletal:     Right lower leg: Edema present.     Left lower leg: Edema present.  Skin:    General: Skin is warm and dry.  Neurological:     General: No focal deficit present.  Mental Status: She is alert. Mental status is at baseline.  Psychiatric:        Mood and Affect: Mood normal.        Behavior: Behavior normal.        Assessment & Plan:   Problem List Items Addressed This Visit       Digestive   Gastroesophageal reflux disease with esophagitis without hemorrhage    Following with GI, gastritis on EGD. Being treated for H. Pylori but patient hasn't picked up the medication yet. Reviewed EGD and colonoscopy results from 07/10/21. Is currently on Prilosec 40 mg daily and will start Amoxicillin for H. Pylori soon.          Endocrine   Hypothyroidism    Following with Endocrinology but just switched from [facility] to the [facility]. Currently on Levothyroxine 100 mcg and Cytomel 5 mcg daily. Plan to recheck thyroid function today.        Relevant Orders   Thyroid Panel With TSH     Other   Urinary frequency    UA in the office negative for nitrite or leukocytes with trace blood.       Relevant Orders   POCT urinalysis dipstick (Completed)   History of breast cancer - Primary    History of left invasive ductal carcinoma, first diagnosed in 2019 that was treated with bilateral mastectomy, radiation and chemo. She is currently on Aromasin 25 mg daily. She is following with [facility], note from 07/06/21 reviewed. She had been having more back pain, but bone scan from 07/04/21 showed no  signs of metastasis but she had some degenerative disease. She is complaining of post-radiation pain and would like to discuss pain medications. I told her that I do not write for controlled substances and she would have to see pain management. She states she had seen pain management before and was treated with Nortriptyline, which she stopped taking. Referral placed to pain management to discuss options. Due for routine screening labs which were ordered today.        Relevant Orders   CBC w/Diff/Platelet   COMPLETE METABOLIC PANEL WITH GFR   Ambulatory referral to Pain Clinic   Ambulatory referral to Gynecology   Other Visit Diagnoses     Abnormal urine odor       Relevant Orders   POCT urinalysis dipstick (Completed)   Lipid screening    - Due for screening.    Relevant Orders   Lipid Profile   Need for hepatitis C screening test    - Due for screening.    Relevant Orders   Hepatitis C Antibody   Vitamin D deficiency    - Recheck Vitamin D level, currently taking 2000 IU of OTC Vitamin D supplements.    Relevant Orders   Vitamin D (25 hydroxy)   Abnormal bruising    - Check PT/INR with above labs.    Relevant Orders   INR/PT       Return in about 3 months (around 11/01/2021).   Teodora Medici, DO

## 2021-08-01 NOTE — Assessment & Plan Note (Signed)
History of left invasive ductal carcinoma, first diagnosed in 2019 that was treated with bilateral mastectomy, radiation and chemo. She is currently on Aromasin 25 mg daily. She is following with [facility], note from 07/06/21 reviewed. She had been having more back pain, but bone scan from 07/04/21 showed no signs of metastasis but she had some degenerative disease. She is complaining of post-radiation pain and would like to discuss pain medications. I told her that I do not write for controlled substances and she would have to see pain management. She states she had seen pain management before and was treated with Nortriptyline, which she stopped taking. Referral placed to pain management to discuss options.

## 2021-08-01 NOTE — Patient Instructions (Addendum)
It was great seeing you today!  Plan discussed at today's visit: -Blood work ordered today, results will be uploaded to [place]. Lab hours are from 8-12 or 2-4, please come fasting at least 8-12 hours  -Urine here negative for signs of infection but there was small amount of blood  -Referrals placed to pain management and OBGYN   Follow up in: 3 months   Take care and let us know if you have any questions or concerns prior to your next visit.  Dr. Rosana Berger

## 2021-08-01 NOTE — Assessment & Plan Note (Signed)
Following with GI, gastritis on EGD. Being treated for H. Pylori but patient hasn't picked up the medication yet. Reviewed EGD and colonoscopy results from 07/10/21. Is currently on Prilosec 40 mg daily and will start Amoxicillin for H. Pylori soon.

## 2021-08-01 NOTE — Assessment & Plan Note (Signed)
UA in the office negative for nitrite or leukocytes with trace blood.

## 2021-08-02 ENCOUNTER — Telehealth: Payer: Self-pay

## 2021-08-02 NOTE — Telephone Encounter (Signed)
[facility] referring for Uti frequency. Sch with any provider. Called and left voicemail for patient to call back to be scheduled.

## 2021-08-03 NOTE — Telephone Encounter (Signed)
Patient is scheduled for 08/22/21 with 9:15 am with LMD

## 2021-08-09 LAB — COMPLETE METABOLIC PANEL WITH GFR
AG Ratio: 1.7 (calc) (ref 1.0–2.5)
ALT: 19 U/L (ref 6–29)
AST: 17 U/L (ref 10–35)
Albumin: 4.5 g/dL (ref 3.6–5.1)
Alkaline phosphatase (APISO): 59 U/L (ref 37–153)
BUN/Creatinine Ratio: 36 (calc) — ABNORMAL HIGH (ref 6–22)
BUN: 17 mg/dL (ref 7–25)
CO2: 28 mmol/L (ref 20–32)
Calcium: 9.5 mg/dL (ref 8.6–10.4)
Chloride: 103 mmol/L (ref 98–110)
Creat: 0.47 mg/dL — ABNORMAL LOW (ref 0.50–1.03)
Globulin: 2.6 g/dL (calc) (ref 1.9–3.7)
Glucose, Bld: 108 mg/dL — ABNORMAL HIGH (ref 65–99)
Potassium: 4.6 mmol/L (ref 3.5–5.3)
Sodium: 140 mmol/L (ref 135–146)
Total Bilirubin: 0.4 mg/dL (ref 0.2–1.2)
Total Protein: 7.1 g/dL (ref 6.1–8.1)
eGFR: 110 mL/min/{1.73_m2} (ref >=60)

## 2021-08-09 LAB — CBC WITH DIFFERENTIAL/PLATELET
Absolute Monocytes: 474 cells/uL (ref 200–950)
Basophils Absolute: 42 cells/uL (ref 0–200)
Basophils Relative: 0.7 %
Eosinophils Absolute: 168 cells/uL (ref 15–500)
Eosinophils Relative: 2.8 %
HCT: 43 % (ref 35.0–45.0)
Hemoglobin: 14.2 g/dL (ref 11.7–15.5)
Lymphs Abs: 2250 cells/uL (ref 850–3900)
MCH: 30 pg (ref 27.0–33.0)
MCHC: 33 g/dL (ref 32.0–36.0)
MCV: 90.7 fL (ref 80.0–100.0)
MPV: 9.7 fL (ref 7.5–12.5)
Monocytes Relative: 7.9 %
Neutro Abs: 3066 cells/uL (ref 1500–7800)
Neutrophils Relative %: 51.1 %
Platelets: 254 10*3/uL (ref 140–400)
RBC: 4.74 10*6/uL (ref 3.80–5.10)
RDW: 12.3 % (ref 11.0–15.0)
Total Lymphocyte: 37.5 %
WBC: 6 10*3/uL (ref 3.8–10.8)

## 2021-08-09 LAB — THYROID PANEL WITH TSH
Free Thyroxine Index: 2.2 (ref 1.4–3.8)
T3 Uptake: 28 % (ref 22–35)
T4, Total: 8 ug/dL (ref 5.1–11.9)
TSH: 4.81 mIU/L — ABNORMAL HIGH (ref 0.40–4.50)

## 2021-08-09 LAB — VITAMIN D 25 HYDROXY (VIT D DEFICIENCY, FRACTURES): Vit D, 25-Hydroxy: 32 ng/mL (ref 30–100)

## 2021-08-09 LAB — LIPID PANEL
Cholesterol: 241 mg/dL — ABNORMAL HIGH (ref <200)
HDL: 69 mg/dL (ref >=50)
LDL Cholesterol (Calc): 151 mg/dL (calc) — ABNORMAL HIGH
Non-HDL Cholesterol (Calc): 172 mg/dL (calc) — ABNORMAL HIGH (ref <130)
Total CHOL/HDL Ratio: 3.5 (calc) (ref <5.0)
Triglycerides: 101 mg/dL (ref <150)

## 2021-08-09 LAB — HEPATITIS C ANTIBODY
Hepatitis C Ab: NONREACTIVE
SIGNAL TO CUT-OFF: 0.15 (ref <1.00)

## 2021-08-09 LAB — PROTIME-INR
INR: 0.9
Prothrombin Time: 9.6 s (ref 9.0–11.5)

## 2021-08-11 ENCOUNTER — Telehealth: Payer: Self-pay

## 2021-08-11 DIAGNOSIS — E7800 Pure hypercholesterolemia, unspecified: Secondary | ICD-10-CM

## 2021-08-11 MED ORDER — ROSUVASTATIN CALCIUM 5 MG PO TABS: 5.0000 mg | ORAL_TABLET | Freq: Every day | ORAL | 3 refills | Status: DC

## 2021-08-11 NOTE — Addendum Note (Signed)
Addended by: Teodora Medici on: 08/11/2021 02:56 PM   Modules accepted: Orders

## 2021-08-11 NOTE — Telephone Encounter (Signed)
-----   Message from Cathrine Muster, [place] sent at 08/11/2021  9:00 AM EDT ----- Left detailed vm

## 2021-08-11 NOTE — Telephone Encounter (Signed)
Pt returned our call for results. Shared provider's note.  Pt has had thyroid medication adjusted, but still feels poorly.  Pt states that her diet is low in red meat and she eats very well. She states that she has a cardiologist in her family who thinks she should be given medication for cholesterol. PT would like to start a medication.  PT states that is still bruising and needs to know why.  PT states that her urine has a funny smell - sometimes sweet, sometimes like ammonia. She also states that she has urgency.   PT would like a call back regarding these issues.   Teodora Medici, DO  08/10/2021  4:34 PM EDT     No abnormalities noted with kidneys, liver or electrolytes.  TSH is slightly elevated at 4.81, recommend she discuss with her endocrinologist.  No anemia present.  Vitamin D level normal.  Hepatitis C screening negative.  INR to test for abnormal bruising normal.  Bad type of cholesterol elevated at 151.  At this point overall cardiac risk low, recommend decreasing red meats and fatty processed foods in the diet.

## 2021-08-14 NOTE — Telephone Encounter (Signed)
Pt declined to schedule an appt at this time. She is out of town

## 2021-08-15 ENCOUNTER — Ambulatory Visit: Payer: Self-pay | Admitting: Internal Medicine

## 2021-08-22 ENCOUNTER — Ambulatory Visit (INDEPENDENT_AMBULATORY_CARE_PROVIDER_SITE_OTHER): Payer: BC Managed Care – PPO | Admitting: Licensed Practical Nurse

## 2021-08-22 ENCOUNTER — Other Ambulatory Visit (HOSPITAL_COMMUNITY)
Admission: RE | Admit: 2021-08-22 | Discharge: 2021-08-22 | Disposition: A | Discharge status: Home / Self Care | Payer: BC Managed Care – PPO | Source: Ambulatory Visit | Attending: Licensed Practical Nurse | Admitting: Licensed Practical Nurse

## 2021-08-22 VITALS — BP 120/78 | Ht 59.0 in | Wt 165.0 lb

## 2021-08-22 DIAGNOSIS — R399 Unspecified symptoms and signs involving the genitourinary system: Secondary | ICD-10-CM

## 2021-08-22 DIAGNOSIS — Z1273 Encounter for screening for malignant neoplasm of ovary: Secondary | ICD-10-CM | POA: Diagnosis not present

## 2021-08-22 DIAGNOSIS — Z124 Encounter for screening for malignant neoplasm of cervix: Secondary | ICD-10-CM

## 2021-08-22 LAB — POCT URINALYSIS DIPSTICK
Bilirubin, UA: NEGATIVE
Blood, UA: NEGATIVE
Glucose, UA: NEGATIVE
Ketones, UA: NEGATIVE
Leukocytes, UA: NEGATIVE
Nitrite, UA: NEGATIVE
Protein, UA: NEGATIVE
Spec Grav, UA: 1.025 (ref 1.010–1.025)
Urobilinogen, UA: 0.2 E.U./dL
pH, UA: 5 (ref 5.0–8.0)

## 2021-08-24 LAB — CYTOLOGY - PAP
Chlamydia: NEGATIVE
Comment: NEGATIVE
Comment: NEGATIVE
Comment: NEGATIVE
Comment: NORMAL
Diagnosis: UNDETERMINED — AB
High risk HPV: NEGATIVE
Neisseria Gonorrhea: NEGATIVE
Trichomonas: NEGATIVE

## 2021-08-25 LAB — URINE CULTURE

## 2021-08-31 IMAGING — US US PELVIS COMPLETE WITH TRANSVAGINAL
1 series · 13 of 25 positions shown · non-contrast
Comparison: None

CLINICAL DATA: Pelvic pain in a female for 1 month LEFT greater
than RIGHT; breast cancer post chemotherapy, now on hormone therapy



[Series 1: us pelvis complete with transvaginal · 0.22mm/px · 13 of 94 slices shown]
[im 1/94]
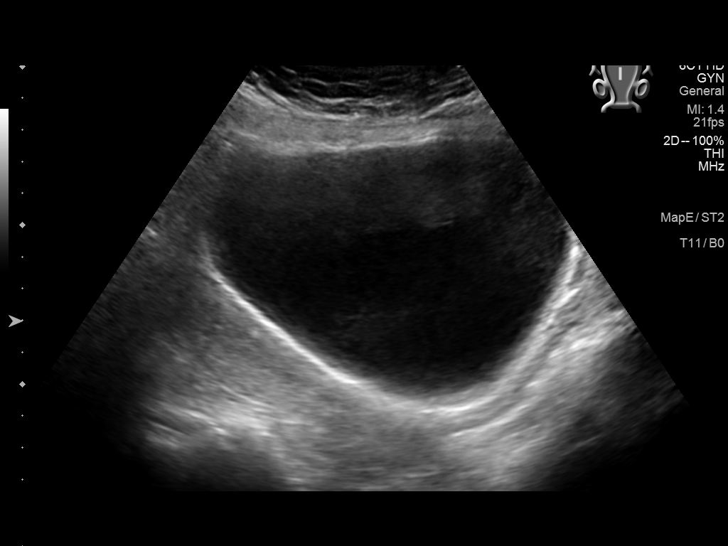
[im 8/94]
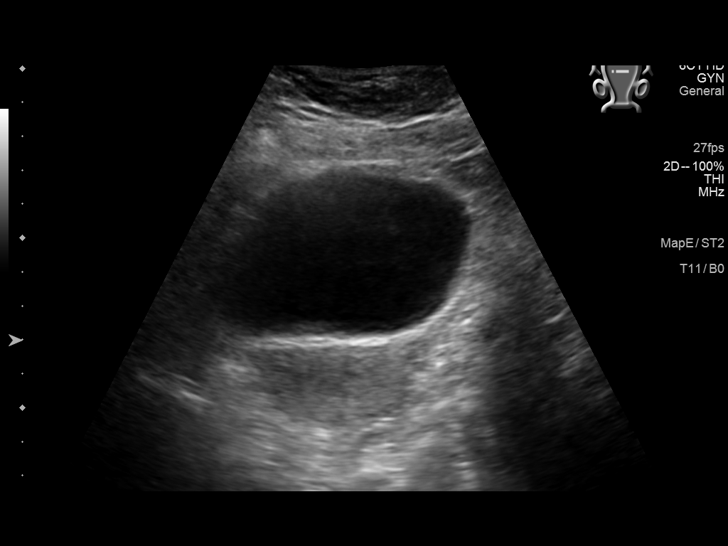
[im 16/94]
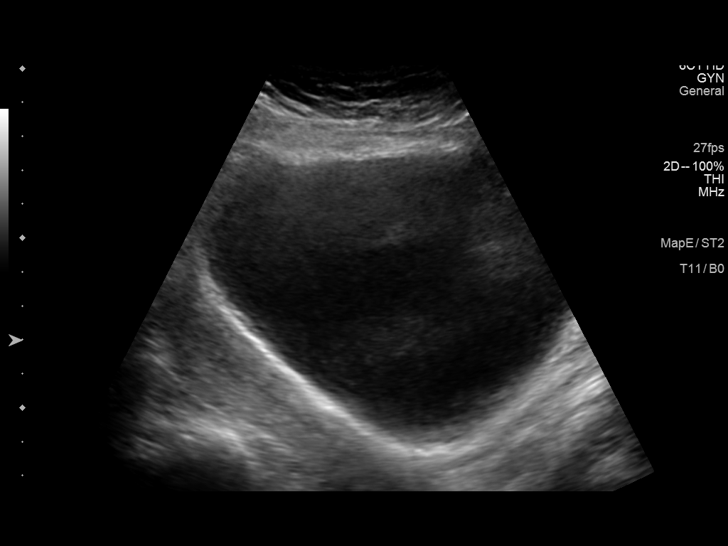
[im 24/94]
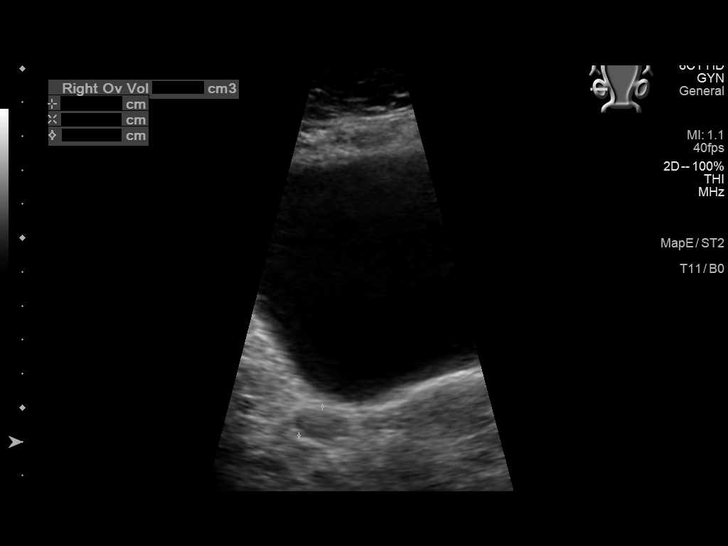
[im 32/94]
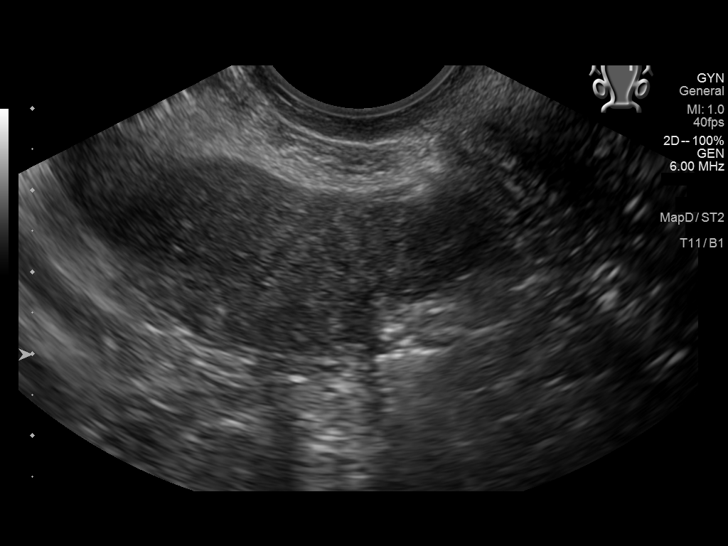
[im 39/94]
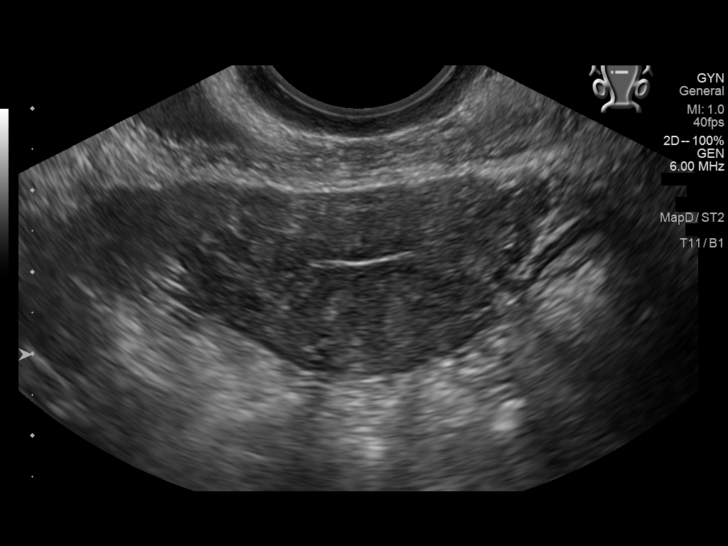
[im 47/94]
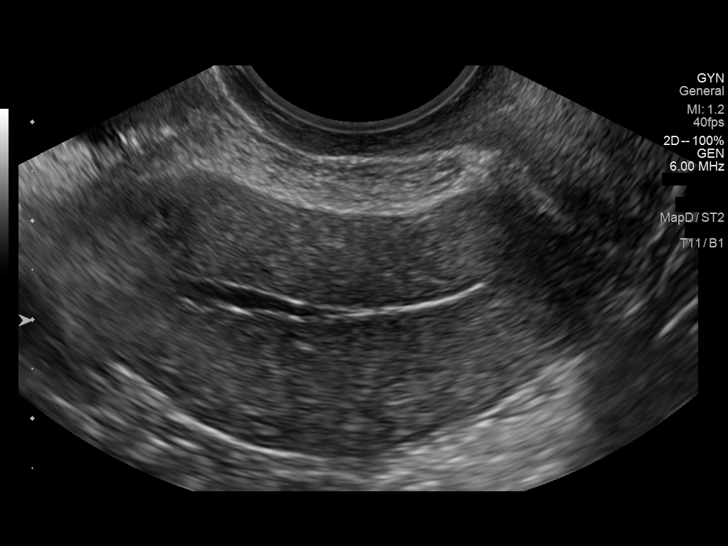
[im 55/94]
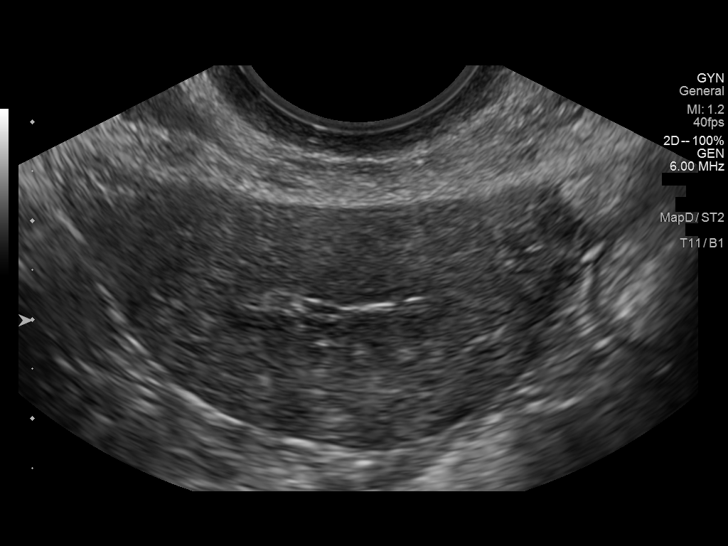
[im 63/94]
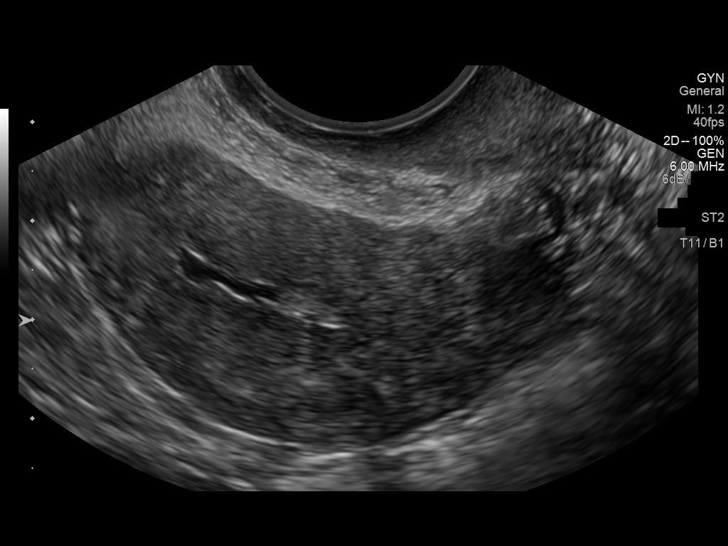
[im 70/94]
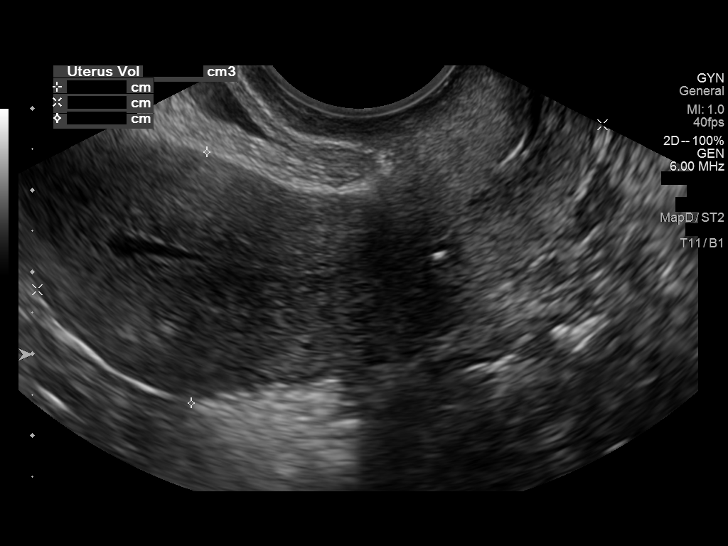
[im 78/94]
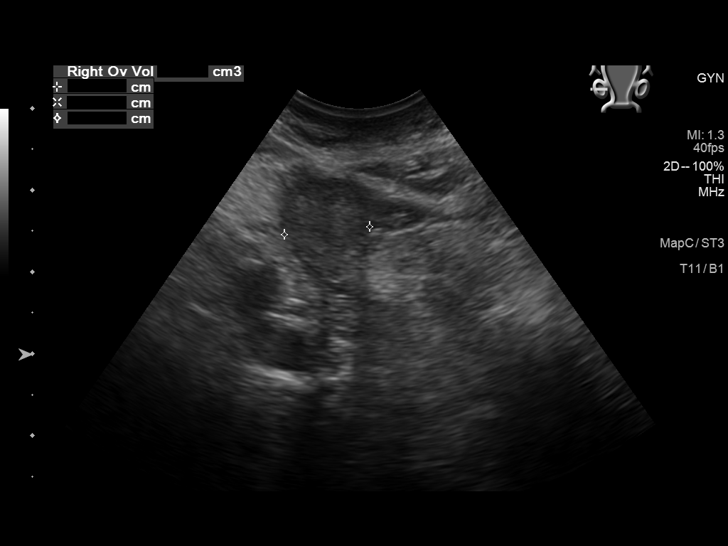
[im 86/94]
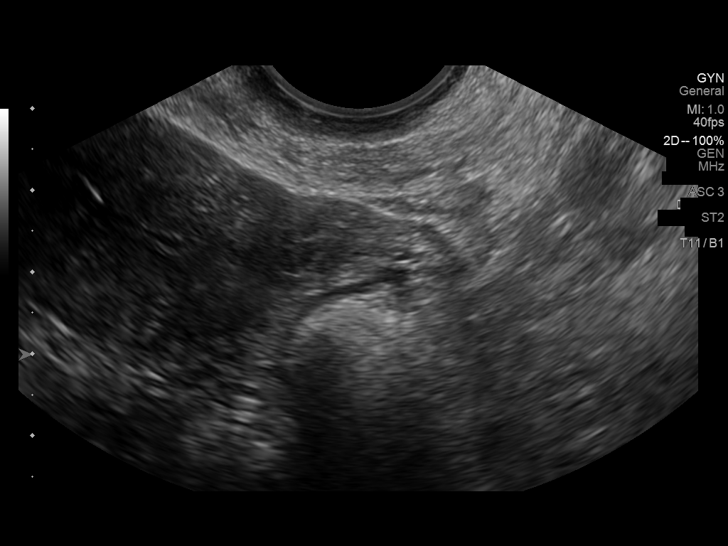
[im 94/94]
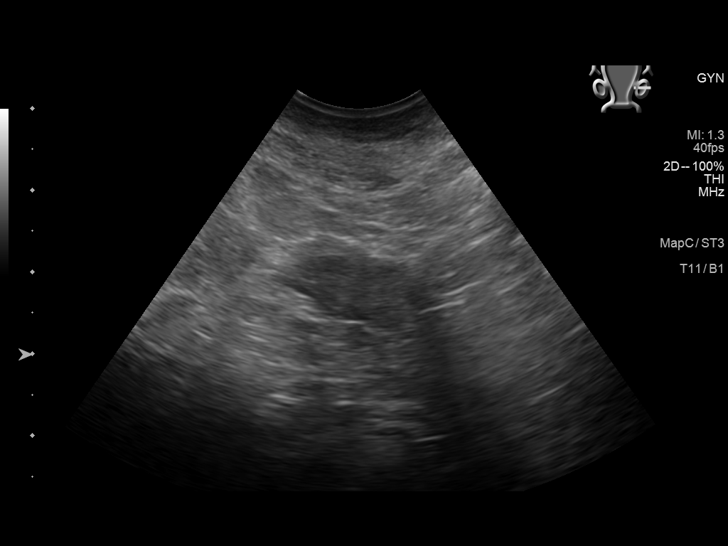

[13 of 25 positions shown; findings below may reference images not displayed]

FINDINGS: Uterus

Measurements: 9.6 x 2.2 x 4.2 cm = volume: 48 mL. Anteverted. Normal
morphology without mass

Endometrium

Thickness: 1.2 mm. Small amount of nonspecific endometrial fluid at
upper uterine segment. Questionable small mass/polyp at upper
uterine segment to RIGHT 8 x 4 x 2 mm versus focus of complicated
fluid.

Right ovary

Measurements: 2.3 x 0.8 x 1.1 cm = volume: 1.1 mL. Normal morphology
without mass

Left ovary

Measurements: 1.7 x 1.0 x 0.8 cm = volume: 0.7 mL. Normal morphology
without mass

Other findings

No free pelvic fluid or adnexal masses.
IMPRESSION: Small amount of nonspecific endometrial fluid with a potential small
endometrial polyp 8 x 4 x 2 mm; recommend sonohysterogram for
further evaluation, prior to hysteroscopy or endometrial biopsy.

These results will be called to the ordering clinician or
representative by the Radiologist Assistant, and communication
documented in the PACS or zVision Dashboard.

## 2021-09-03 DIAGNOSIS — Z789 Other specified health status: Secondary | ICD-10-CM | POA: Insufficient documentation

## 2021-09-03 DIAGNOSIS — M899 Disorder of bone, unspecified: Secondary | ICD-10-CM | POA: Insufficient documentation

## 2021-09-03 DIAGNOSIS — G894 Chronic pain syndrome: Secondary | ICD-10-CM | POA: Insufficient documentation

## 2021-09-03 DIAGNOSIS — R9389 Abnormal findings on diagnostic imaging of other specified body structures: Secondary | ICD-10-CM | POA: Insufficient documentation

## 2021-09-03 DIAGNOSIS — Z79899 Other long term (current) drug therapy: Secondary | ICD-10-CM | POA: Insufficient documentation

## 2021-09-03 NOTE — Progress Notes (Unsigned)
Patient: Caroline Lamb  Service Category: E/M  Provider: Gaspar Cola, MD  DOB: 01-20-1961  DOS: 09/04/2021  Referring Provider: Teodora Medici, DO  MRN: [id number]  Setting: Ambulatory outpatient  PCP: Teodora Medici, DO  Type: New Patient  Specialty: Interventional Pain Management    Location: Office  Delivery: Face-to-face     Primary Reason(s) for Visit: Encounter for initial evaluation of one or more chronic problems (new to examiner) potentially causing chronic pain, and posing a threat to normal musculoskeletal function. (Level of risk: High) CC: No chief complaint on file.  HPI  [name] is a 61 y.o. year old, female patient, who comes for the first time to our practice referred by Teodora Medici, DO for our initial evaluation of her chronic pain. She has Urinary frequency; Hyperlipidemia; Neutropenic fever ([place]); History of breast cancer; Hypothyroidism; Gastroesophageal reflux disease with esophagitis without hemorrhage; Hypothyroidism due to Hashimoto's thyroiditis; Invasive ductal carcinoma of breast, female, left ([place]); Low vitamin D level; Malignant neoplasm of lower-outer quadrant of left breast of female, estrogen receptor positive ([place]); Thickened endometrium; Chronic pain syndrome; Pharmacologic therapy; Disorder of skeletal system; and Problems influencing health status on their problem list. Today she comes in for evaluation of her No chief complaint on file.  Pain Assessment: Location:     Radiating:   Onset:   Duration:   Quality:   Severity:  /10 (subjective, self-reported pain score)  Effect on ADL:   Timing:   Modifying factors:   BP:    HR:    Onset and Duration: {Hx; Onset and Duration:210120511} Cause of pain: {Hx; Cause:210120521} Severity: {Pain Severity:210120502} Timing: {Symptoms; Timing:210120501} Aggravating Factors: {Causes; Aggravating pain factors:210120507} Alleviating Factors: {Causes; Alleviating Factors:210120500} Associated  Problems: {Hx; Associated problems:210120515} Quality of Pain: {Hx; Symptom quality or Descriptor:210120531} Previous Examinations or Tests: {Hx; Previous examinations or test:210120529} Previous Treatments: {Hx; Previous Treatment:210120503}  ***  Today I took the time to provide the patient with information regarding my pain practice. The patient was informed that my practice is divided into two sections: an interventional pain management section, as well as a completely separate and distinct medication management section. I explained that I have procedure days for my interventional therapies, and evaluation days for follow-ups and medication management. Because of the amount of documentation required during both, they are kept separated. This means that there is the possibility that she may be scheduled for a procedure on one day, and medication management the next. I have also informed her that because of staffing and facility limitations, I no longer take patients for medication management only. To illustrate the reasons for this, I gave the patient the example of surgeons, and how inappropriate it would be to refer a patient to his/her care, just to write for the post-surgical antibiotics on a surgery done by a different surgeon.   Because interventional pain management is my board-certified specialty, the patient was informed that joining my practice means that they are open to any and all interventional therapies. I made it clear that this does not mean that they will be forced to have any procedures done. What this means is that I believe interventional therapies to be essential part of the diagnosis and proper management of chronic pain conditions. Therefore, patients not interested in these interventional alternatives will be better served under the care of a different practitioner.  The patient was also made aware of my Comprehensive Pain Management Safety Guidelines where by joining my  practice, they limit all of  their nerve blocks and joint injections to those done by our practice, for as long as we are retained to manage their care.   Historic Controlled Substance Pharmacotherapy Review  PMP and historical list of controlled substances: ***  Current opioid analgesics:   *** MME/day: *** mg/day  Historical Monitoring: The patient  reports no history of drug use. List of all UDS Test(s): No results found for: "MDMA", "COCAINSCRNUR", "PCPSCRNUR", "PCPQUANT", "CANNABQUANT", "THCU", "ETH" List of other Serum/Urine Drug Screening Test(s):  No results found for: "AMPHSCRSER", "BARBSCRSER", "BENZOSCRSER", "COCAINSCRSER", "COCAINSCRNUR", "PCPSCRSER", "PCPQUANT", "THCSCRSER", "THCU", "CANNABQUANT", "OPIATESCRSER", "OXYSCRSER", "PROPOXSCRSER", "ETH" Historical Background Evaluation: [place] PMP: PDMP reviewed during this encounter. Review of the past 61-month conducted.             PMP NARX Score Report:  Narcotic: *** Sedative: *** Stimulant: *** [place] Department of public safety, offender search: (Editor, commissioningInformation) Non-contributory Risk Assessment Profile: Aberrant behavior: None observed or detected today Risk factors for fatal opioid overdose: None identified today PMP NARX Overdose Risk Score: *** Fatal overdose hazard ratio (HR): Calculation deferred Non-fatal overdose hazard ratio (HR): Calculation deferred Risk of opioid abuse or dependence: 0.7-3.0% with doses ? 36 MME/day and 6.1-26% with doses ? 120 MME/day. Substance use disorder (SUD) risk level: See below Personal History of Substance Abuse (SUD-Substance use disorder):  Alcohol:    Illegal Drugs:    Rx Drugs:    ORT Risk Level calculation:    ORT Scoring interpretation table:  Score <3 = Low Risk for SUD  Score between 4-7 = Moderate Risk for SUD  Score >8 = High Risk for Opioid Abuse   PHQ-2 Depression Scale:  Total score:    PHQ-2 Scoring interpretation table: (Score and probability of major depressive  disorder)  Score 0 = No depression  Score 1 = 15.4% Probability  Score 2 = 21.1% Probability  Score 3 = 38.4% Probability  Score 4 = 45.5% Probability  Score 5 = 56.4% Probability  Score 6 = 78.6% Probability   PHQ-9 Depression Scale:  Total score:    PHQ-9 Scoring interpretation table:  Score 0-4 = No depression  Score 5-9 = Mild depression  Score 10-14 = Moderate depression  Score 15-19 = Moderately severe depression  Score 20-27 = Severe depression (2.4 times higher risk of SUD and 2.89 times higher risk of overuse)   Pharmacologic Plan: As per protocol, I have not taken over any controlled substance management, pending the results of ordered tests and/or consults.            Initial impression: Pending review of available data and ordered tests.  Meds   Current Outpatient Medications:    acetaminophen (TYLENOL) 500 MG tablet, Take 500-1,000 mg by mouth every 6 (six) hours as needed for mild pain., Disp: , Rfl:    amoxicillin (AMOXIL) 500 MG capsule, Take 1,000 mg by mouth 2 (two) times daily. (Patient not taking: Reported on 08/22/2021), Disp: , Rfl:    Cholecalciferol (VITAMIN D3 PO), Take 2,000 Units by mouth daily., Disp: , Rfl:    exemestane (AROMASIN) 25 MG tablet, Take 25 mg by mouth at bedtime. , Disp: , Rfl:    levothyroxine (SYNTHROID) 100 MCG tablet, Take 100 mcg by mouth daily before breakfast. , Disp: , Rfl:    liothyronine (CYTOMEL) 5 MCG tablet, Take 5 mcg by mouth daily before breakfast., Disp: , Rfl:    omeprazole (PRILOSEC) 40 MG capsule, Take 40 mg by mouth daily. (Patient not taking: Reported on 08/22/2021), Disp: ,  Rfl:    Polyethyl Glycol-Propyl Glycol (LUBRICANT EYE DROPS) 0.4-0.3 % SOLN, Place 1 drop into both eyes 3 (three) times daily as needed (dry/irritated eyes.)., Disp: , Rfl:    rosuvastatin (CRESTOR) 5 MG tablet, Take 1 tablet (5 mg total) by mouth daily., Disp: 90 tablet, Rfl: 3  Imaging Review  Cervical Imaging: Cervical MR wo contrast: No  results found for this or any previous visit.  Cervical MR wo contrast: No valid procedures specified. Cervical MR w/wo contrast: No results found for this or any previous visit.  Cervical MR w contrast: No results found for this or any previous visit.  Cervical CT wo contrast: No results found for this or any previous visit.  Cervical CT w/wo contrast: No results found for this or any previous visit.  Cervical CT w/wo contrast: No results found for this or any previous visit.  Cervical CT w contrast: No results found for this or any previous visit.  Cervical CT outside: No results found for this or any previous visit.  Cervical DG 1 view: No results found for this or any previous visit.  Cervical DG 2-3 views: No results found for this or any previous visit.  Cervical DG F/E views: No results found for this or any previous visit.  Cervical DG 2-3 clearing views: No results found for this or any previous visit.  Cervical DG Bending/F/E views: No results found for this or any previous visit.  Cervical DG complete: No results found for this or any previous visit.  Cervical DG Myelogram views: No results found for this or any previous visit.  Cervical DG Myelogram views: No results found for this or any previous visit.  Cervical Discogram views: No results found for this or any previous visit.   Shoulder Imaging: Shoulder-R MR w contrast: No results found for this or any previous visit.  Shoulder-L MR w contrast: No results found for this or any previous visit.  Shoulder-R MR w/wo contrast: No results found for this or any previous visit.  Shoulder-L MR w/wo contrast: No results found for this or any previous visit.  Shoulder-R MR wo contrast: No results found for this or any previous visit.  Shoulder-L MR wo contrast: No results found for this or any previous visit.  Shoulder-R CT w contrast: No results found for this or any previous visit.  Shoulder-L CT w contrast:  No results found for this or any previous visit.  Shoulder-R CT w/wo contrast: No results found for this or any previous visit.  Shoulder-L CT w/wo contrast: No results found for this or any previous visit.  Shoulder-R CT wo contrast: No results found for this or any previous visit.  Shoulder-L CT wo contrast: No results found for this or any previous visit.  Shoulder-R DG Arthrogram: No results found for this or any previous visit.  Shoulder-L DG Arthrogram: No results found for this or any previous visit.  Shoulder-R DG 1 view: No results found for this or any previous visit.  Shoulder-L DG 1 view: No results found for this or any previous visit.  Shoulder-R DG: No results found for this or any previous visit.  Shoulder-L DG: No results found for this or any previous visit.   Thoracic Imaging: Thoracic MR wo contrast: No results found for this or any previous visit.  Thoracic MR wo contrast: No valid procedures specified. Thoracic MR w/wo contrast: No results found for this or any previous visit.  Thoracic MR w contrast: No results found for  this or any previous visit.  Thoracic CT wo contrast: No results found for this or any previous visit.  Thoracic CT w/wo contrast: No results found for this or any previous visit.  Thoracic CT w/wo contrast: No results found for this or any previous visit.  Thoracic CT w contrast: Results for orders placed during the hospital encounter of 07/24/17  CT THORACIC SPINE W CONTRAST  Narrative CLINICAL DATA:  Back pain, breast cancer  EXAM: CT THORACIC SPINE WITH CONTRAST  TECHNIQUE: Multidetector CT images of thoracic was performed according to the standard protocol following intravenous contrast administration.  CONTRAST:  146m ISOVUE-300 IOPAMIDOL (ISOVUE-300) INJECTION 61%  COMPARISON:  None.  FINDINGS: Alignment: Normal thoracic kyphosis.  Vertebrae: Vertebral body heights and intervertebral disc spaces  are maintained.  No focal osseous lesions.  Paraspinal and other soft tissues: Visualized lungs are motion degraded but clear.  No suspicious mediastinal lymphadenopathy.  Disc levels: Mild degenerative changes of the lower thoracic spine. Spinal canal is patent.  IMPRESSION: Negative thoracic spine CT.  No focal osseous lesions.   Electronically Signed By: SJulian HyM.D. On: 07/25/2017 01:09  Thoracic DG 2-3 views: No results found for this or any previous visit.  Thoracic DG 4 views: No results found for this or any previous visit.  Thoracic DG: No results found for this or any previous visit.  Thoracic DG w/swimmers view: No results found for this or any previous visit.  Thoracic DG Myelogram views: No results found for this or any previous visit.  Thoracic DG Myelogram views: No results found for this or any previous visit.   Lumbosacral Imaging: Lumbar MR wo contrast: Results for orders placed during the hospital encounter of 07/24/17  MR LUMBAR SPINE WO CONTRAST  Narrative CLINICAL DATA:  61y/o F; back pain radiating to bilateral mid thighs.  EXAM: MRI LUMBAR SPINE WITHOUT CONTRAST  TECHNIQUE: Multiplanar, multisequence MR imaging of the lumbar spine was performed. No intravenous contrast was administered.  COMPARISON:  07/25/2017 CT abdomen and pelvis.  FINDINGS: Segmentation:  Standard.  Alignment:  Physiologic.  Vertebrae: No fracture, evidence of discitis, or bone lesion. Mild right-sided L4-5 facet edema, likely degenerative.  Conus medullaris and cauda equina: Conus extends to the L1 level. Fatty filum. Linear increased signal within the central conus medullaris, likely a ventriculus terminalis. S2 level Tarlov cysts.  Paraspinal and other soft tissues: Negative.  Disc levels:  L1-2: No significant disc displacement, foraminal stenosis, or canal stenosis.  L2-3: Mild disc bulge and facet hypertrophy. Small right subarticular  annular fissure. No significant foraminal or canal stenosis.  L3-4: Mild disc bulge and facet hypertrophy. No significant foraminal or canal stenosis.  L4-5: Mild disc bulge and small central disc protrusion with mild facet hypertrophy. No significant foraminal or canal stenosis.  L5-S1: Mild disc bulge and facet hypertrophy. No significant foraminal or canal stenosis.  IMPRESSION: 1. Mild right L4-5 degenerative facet edema. 2. Fatty filum and ventriculus terminalis. 3. Mild multilevel disc and facet degenerative changes from L2-3 through L5-S1. No significant foraminal or canal stenosis.   Electronically Signed By: LKristine GarbeM.D. On: 07/25/2017 03:13  Lumbar MR wo contrast: No valid procedures specified. Lumbar MR w/wo contrast: No results found for this or any previous visit.  Lumbar MR w/wo contrast: No results found for this or any previous visit.  Lumbar MR w contrast: No results found for this or any previous visit.  Lumbar CT wo contrast: No results found for this or any previous  visit.  Lumbar CT w/wo contrast: No results found for this or any previous visit.  Lumbar CT w/wo contrast: No results found for this or any previous visit.  Lumbar CT w contrast: Results for orders placed during the hospital encounter of 07/24/17  CT LUMBAR SPINE W CONTRAST  Narrative CLINICAL DATA:  Back pain, breast cancer  EXAM: CT LUMBAR SPINE WITH CONTRAST  TECHNIQUE: Multidetector CT imaging of the lumbar spine was performed with intravenous contrast administration.  CONTRAST:  15m ISOVUE-300 IOPAMIDOL (ISOVUE-300) INJECTION 61%  COMPARISON:  CT abdomen/pelvis dated 02/04/2017.  FINDINGS: Segmentation: 5 lumbar-type vertebral bodies.  Alignment: Normal lumbar lordosis.  Vertebrae: Vertebral body heights and intervertebral disc spaces are maintained. No focal osseous lesions.  Paraspinal and other soft tissues: Retroaortic left renal vein.  No  suspicious abdominopelvic lymphadenopathy.  Disc levels: Mild degenerative changes at L1-2, L2-3, and L3-4.  Spinal canal is patent.  IMPRESSION: Negative lumbar spine CT.  No focal osseous lesions.   Electronically Signed By: SJulian HyM.D. On: 07/25/2017 01:12  Lumbar DG 1V: No results found for this or any previous visit.  Lumbar DG 1V (Clearing): No results found for this or any previous visit.  Lumbar DG 2-3V (Clearing): No results found for this or any previous visit.  Lumbar DG 2-3 views: No results found for this or any previous visit.  Lumbar DG (Complete) 4+V: No results found for this or any previous visit.        Lumbar DG F/E views: No results found for this or any previous visit.        Lumbar DG Bending views: No results found for this or any previous visit.        Lumbar DG Myelogram views: No results found for this or any previous visit.  Lumbar DG Myelogram: No results found for this or any previous visit.  Lumbar DG Myelogram: No results found for this or any previous visit.  Lumbar DG Myelogram: No results found for this or any previous visit.  Lumbar DG Myelogram Lumbosacral: No results found for this or any previous visit.  Lumbar DG Diskogram views: No results found for this or any previous visit.  Lumbar DG Diskogram views: No results found for this or any previous visit.  Lumbar DG Epidurogram OP: No results found for this or any previous visit.  Lumbar DG Epidurogram IP: No valid procedures specified.  Sacroiliac Joint Imaging: Sacroiliac Joint DG: No results found for this or any previous visit.  Sacroiliac Joint MR w/wo contrast: No results found for this or any previous visit.  Sacroiliac Joint MR wo contrast: No results found for this or any previous visit.   Spine Imaging: Whole Spine DG Myelogram views: No results found for this or any previous visit.  Whole Spine MR Mets screen: No results found for this or any previous  visit.  Whole Spine MR Mets screen: No results found for this or any previous visit.  Whole Spine MR w/wo: No results found for this or any previous visit.  MRA Spinal Canal w/ cm: No results found for this or any previous visit.  MRA Spinal Canal wo/ cm: No valid procedures specified. MRA Spinal Canal w/wo cm: No results found for this or any previous visit.  Spine Outside MR Films: No results found for this or any previous visit.  Spine Outside CT Films: No results found for this or any previous visit.  CT-Guided Biopsy: No results found for this or any previous visit.  CT-Guided Needle Placement: No results found for this or any previous visit.  DG Spine outside: No results found for this or any previous visit.  IR Spine outside: No results found for this or any previous visit.  NM Spine outside: No results found for this or any previous visit.   Hip Imaging: Hip-R MR w contrast: No results found for this or any previous visit.  Hip-L MR w contrast: No results found for this or any previous visit.  Hip-R MR w/wo contrast: No results found for this or any previous visit.  Hip-L MR w/wo contrast: No results found for this or any previous visit.  Hip-R MR wo contrast: No results found for this or any previous visit.  Hip-L MR wo contrast: No results found for this or any previous visit.  Hip-R CT w contrast: No results found for this or any previous visit.  Hip-L CT w contrast: No results found for this or any previous visit.  Hip-R CT w/wo contrast: No results found for this or any previous visit.  Hip-L CT w/wo contrast: No results found for this or any previous visit.  Hip-R CT wo contrast: No results found for this or any previous visit.  Hip-L CT wo contrast: No results found for this or any previous visit.  Hip-R DG 2-3 views: No results found for this or any previous visit.  Hip-L DG 2-3 views: No results found for this or any previous visit.  Hip-R DG  Arthrogram: No results found for this or any previous visit.  Hip-L DG Arthrogram: No results found for this or any previous visit.  Hip-B DG Bilateral: No results found for this or any previous visit.   Knee Imaging: Knee-R MR w contrast: No results found for this or any previous visit.  Knee-L MR w/o contrast: No results found for this or any previous visit.  Knee-R MR w/wo contrast: No results found for this or any previous visit.  Knee-L MR w/wo contrast: No results found for this or any previous visit.  Knee-R MR wo contrast: No results found for this or any previous visit.  Knee-L MR wo contrast: No results found for this or any previous visit.  Knee-R CT w contrast: No results found for this or any previous visit.  Knee-L CT w contrast: No results found for this or any previous visit.  Knee-R CT w/wo contrast: No results found for this or any previous visit.  Knee-L CT w/wo contrast: No results found for this or any previous visit.  Knee-R CT wo contrast: No results found for this or any previous visit.  Knee-L CT wo contrast: No results found for this or any previous visit.  Knee-R DG 1-2 views: No results found for this or any previous visit.  Knee-L DG 1-2 views: No results found for this or any previous visit.  Knee-R DG 3 views: No results found for this or any previous visit.  Knee-L DG 3 views: No results found for this or any previous visit.  Knee-R DG 4 views: No results found for this or any previous visit.  Knee-L DG 4 views: No results found for this or any previous visit.  Knee-R DG Arthrogram: No results found for this or any previous visit.  Knee-L DG Arthrogram: No results found for this or any previous visit.   Ankle Imaging: Ankle-R DG Complete: No results found for this or any previous visit.  Ankle-L DG Complete: No results found for this or any previous visit.  Foot Imaging: Foot-R DG Complete: No results found for this or any  previous visit.  Foot-L DG Complete: No results found for this or any previous visit.   Elbow Imaging: Elbow-R DG Complete: No results found for this or any previous visit.  Elbow-L DG Complete: No results found for this or any previous visit.   Wrist Imaging: Wrist-R DG Complete: No results found for this or any previous visit.  Wrist-L DG Complete: No results found for this or any previous visit.   Hand Imaging: Hand-R DG Complete: No results found for this or any previous visit.  Hand-L DG Complete: No results found for this or any previous visit.   Complexity Note: Imaging results reviewed. Results shared with Ms. Amedeo Plenty, using Layman's terms.                         ROS  Cardiovascular: {Hx; Cardiovascular History:210120525} Pulmonary or Respiratory: {Hx; Pumonary and/or Respiratory History:210120523} Neurological: {Hx; Neurological:210120504} Psychological-Psychiatric: {Hx; Psychological-Psychiatric History:210120512} Gastrointestinal: {Hx; Gastrointestinal:210120527} Genitourinary: {Hx; Genitourinary:210120506} Hematological: {Hx; Hematological:210120510} Endocrine: {Hx; Endocrine history:210120509} Rheumatologic: {Hx; Rheumatological:210120530} Musculoskeletal: {Hx; Musculoskeletal:210120528} Work History: {Hx; Work history:210120514}  Allergies  Ms. Mullan is allergic to contrast media [iodinated contrast media] and sulfa antibiotics.  Laboratory Chemistry Profile   Renal Lab Results  Component Value Date   BUN 17 08/08/2021   CREATININE 0.47 (L) 08/08/2021   BCR 36 (H) 08/08/2021   GFRAA >60 11/22/2017   GFRNONAA >60 05/08/2021   SPECGRAV 1.025 08/22/2021   PHUR 5.0 08/22/2021   PROTEINUR Negative 08/22/2021     Electrolytes Lab Results  Component Value Date   NA 140 08/08/2021   K 4.6 08/08/2021   CL 103 08/08/2021   CALCIUM 9.5 08/08/2021   MG 2.2 12/09/2020   PHOS 3.3 12/09/2020     Hepatic Lab Results  Component Value Date   AST 17  08/08/2021   ALT 19 08/08/2021   ALBUMIN 4.3 05/08/2021   ALKPHOS 61 05/08/2021   LIPASE 28 05/08/2021     ID Lab Results  Component Value Date   HIV Non Reactive 09/26/2017   SARSCOV2NAA POSITIVE (A) 05/08/2021   PREGTESTUR NEGATIVE 01/25/2017     Bone Lab Results  Component Value Date   VD25OH 32 08/08/2021     Endocrine Lab Results  Component Value Date   GLUCOSE 108 (H) 08/08/2021   GLUCOSEU NEGATIVE 05/08/2021   TSH 4.81 (H) 08/08/2021     Neuropathy Lab Results  Component Value Date   HIV Non Reactive 09/26/2017     CNS No results found for: "COLORCSF", "APPEARCSF", "RBCCOUNTCSF", "WBCCSF", "POLYSCSF", "LYMPHSCSF", "EOSCSF", "PROTEINCSF", "GLUCCSF", "JCVIRUS", "CSFOLI", "IGGCSF", "LABACHR", "ACETBL"   Inflammation (CRP: Acute  ESR: Chronic) Lab Results  Component Value Date   LATICACIDVEN 1.0 05/08/2021     Rheumatology No results found for: "RF", "ANA", "LABURIC", "URICUR", "LYMEIGGIGMAB", "LYMEABIGMQN", "HLAB27"   Coagulation Lab Results  Component Value Date   INR 0.9 08/08/2021   LABPROT 9.6 08/08/2021   PLT 254 08/08/2021     Cardiovascular Lab Results  Component Value Date   TROPONINI < 0.02 11/10/2012   HGB 14.2 08/08/2021   HCT 43.0 08/08/2021     Screening Lab Results  Component Value Date   SARSCOV2NAA POSITIVE (A) 05/08/2021   HIV Non Reactive 09/26/2017   PREGTESTUR NEGATIVE 01/25/2017     Cancer No results found for: "CEA", "CA125", "LABCA2"   Allergens No results found for: "ALMOND", "APPLE", "ASPARAGUS", "AVOCADO", "BANANA", "BARLEY", "BASIL", "  BAYLEAF", "GREENBEAN", "LIMABEAN", "WHITEBEAN", "BEEFIGE", "REDBEET", "BLUEBERRY", "BROCCOLI", "CABBAGE", "MELON", "CARROT", "CASEIN", "CASHEWNUT", "CAULIFLOWER", "CELERY"     Note: Lab results reviewed.  [place]  Drug: Ms. Verret  reports no history of drug use. Alcohol:  reports no history of alcohol use. Tobacco:  reports that she has never smoked. She has never used smokeless  tobacco. Medical:  has a past medical history of Anxiety, Arthritis, Asthma, breast, Hyperlipidemia, Hypertension, and Hypothyroidism. Family: family history includes Hematuria in her father and mother; Prostate cancer in her father; Tuberculosis in her paternal grandfather.  Past Surgical History:  Procedure Laterality Date   cesarean     x 2   CESAREAN SECTION     COLONOSCOPY WITH PROPOFOL N/A 02/07/2015   Procedure: COLONOSCOPY WITH PROPOFOL;  Surgeon: Hulen Luster, MD;  Location: [facility] ENDOSCOPY;  Service: Gastroenterology;  Laterality: N/A;   COLONOSCOPY WITH PROPOFOL N/A 07/10/2021   Procedure: COLONOSCOPY WITH PROPOFOL;  Surgeon: Annamaria Helling, DO;  Location: [facility] ENDOSCOPY;  Service: Gastroenterology;  Laterality: N/A;   DIAGNOSTIC LAPAROSCOPY     ESOPHAGOGASTRODUODENOSCOPY N/A 07/10/2021   Procedure: ESOPHAGOGASTRODUODENOSCOPY (EGD);  Surgeon: Annamaria Helling, DO;  Location: [facility] ENDOSCOPY;  Service: Gastroenterology;  Laterality: N/A;   MASS EXCISION Bilateral 12/11/2019   Procedure: EXCISION SOFT TISSUE ANKLE RIGHT AND LEFT;  Surgeon: Samara Deist, DPM;  Location: [facility] ORS;  Service: Podiatry;  Laterality: Bilateral;   MASTECTOMY Bilateral    TENDON REPAIR Bilateral 12/11/2019   Procedure: FLEXOR TENDON REPAIR;  Surgeon: Samara Deist, DPM;  Location: [facility] ORS;  Service: Podiatry;  Laterality: Bilateral;   TONSILLECTOMY     TUBAL LIGATION     Active Ambulatory Problems    Diagnosis Date Noted   Urinary frequency 01/04/2017   Hyperlipidemia    Neutropenic fever ([place]) 09/25/2017   History of breast cancer 08/01/2021   Hypothyroidism 04/15/2018   Gastroesophageal reflux disease with esophagitis without hemorrhage 08/01/2021   Hypothyroidism due to Hashimoto's thyroiditis 10/08/2016   Invasive ductal carcinoma of breast, female, left ([place]) 06/11/2017   Low vitamin D level 10/17/2018   Malignant neoplasm of lower-outer quadrant of left breast of female, estrogen  receptor positive ([place]) 06/14/2017   Thickened endometrium 09/03/2021   Chronic pain syndrome 09/03/2021   Pharmacologic therapy 09/03/2021   Disorder of skeletal system 09/03/2021   Problems influencing health status 09/03/2021   Resolved Ambulatory Problems    Diagnosis Date Noted   No Resolved Ambulatory Problems   Past Medical History:  Diagnosis Date   Anxiety    Arthritis    Asthma    breast    Hypertension    Constitutional Exam  General appearance: Well nourished, well developed, and well hydrated. In no apparent acute distress There were no vitals filed for this visit. BMI Assessment: Estimated body mass index is 33.33 kg/m as calculated from the following:   Height as of 08/22/21: 4' 11"  (1.499 m).   Weight as of 08/22/21: 165 lb (74.8 kg).  BMI interpretation table: BMI level Category Range association with higher incidence of chronic pain  <18 kg/m2 Underweight   18.5-24.9 kg/m2 Ideal body weight   25-29.9 kg/m2 Overweight Increased incidence by 20%  30-34.9 kg/m2 Obese (Class I) Increased incidence by 68%  35-39.9 kg/m2 Severe obesity (Class II) Increased incidence by 136%  >40 kg/m2 Extreme obesity (Class III) Increased incidence by 254%   Patient's current BMI Ideal Body weight  There is no height or weight on file to calculate BMI. Patient must be at  least 60 in tall to calculate ideal body weight   BMI Readings from Last 4 Encounters:  08/22/21 33.33 kg/m  08/01/21 33.73 kg/m  07/10/21 33.33 kg/m  05/08/21 33.33 kg/m   Wt Readings from Last 4 Encounters:  08/22/21 165 lb (74.8 kg)  08/01/21 167 lb (75.8 kg)  07/10/21 165 lb (74.8 kg)  05/08/21 165 lb (74.8 kg)    Psych/Mental status: Alert, oriented x 3 (person, place, & time)       Eyes: PERLA Respiratory: No evidence of acute respiratory distress  Assessment  Primary Diagnosis & Pertinent Problem List: The primary encounter diagnosis was Chronic pain syndrome. Diagnoses of Pharmacologic  therapy, Disorder of skeletal system, and Problems influencing health status were also pertinent to this visit.  Visit Diagnosis (New problems to examiner): 1. Chronic pain syndrome   2. Pharmacologic therapy   3. Disorder of skeletal system   4. Problems influencing health status    Plan of Care (Initial workup plan)  Note: Ms. Choudhry was reminded that as per protocol, today's visit has been an evaluation only. We have not taken over the patient's controlled substance management.  Problem-specific plan: No problem-specific Assessment & Plan notes found for this encounter.  Lab Orders  No laboratory test(s) ordered today   Imaging Orders  No imaging studies ordered today   Referral Orders  No referral(s) requested today   Procedure Orders    No procedure(s) ordered today   Pharmacotherapy (current): Medications ordered:  No orders of the defined types were placed in this encounter.  Medications administered during this visit: Vara L. Drummer had no medications administered during this visit.   Pharmacological management options:  Opioid Analgesics: The patient was informed that there is no guarantee that she would be a candidate for opioid analgesics. The decision will be made following CDC guidelines. This decision will be based on the results of diagnostic studies, as well as Ms. Kennard's risk profile.   Membrane stabilizer: To be determined at a later time  Muscle relaxant: To be determined at a later time  NSAID: To be determined at a later time  Other analgesic(s): To be determined at a later time   Interventional management options: Ms. Lacewell was informed that there is no guarantee that she would be a candidate for interventional therapies. The decision will be based on the results of diagnostic studies, as well as Ms. Beranek's risk profile.  Procedure(s) under consideration:  Pending results of ordered studies      Interventional Therapies  Risk  Complexity  Considerations:   Estimated body mass index is 33.33 kg/m as calculated from the following:   Height as of 08/22/21: 4' 11"  (1.499 m).   Weight as of 08/22/21: 165 lb (74.8 kg). WNL   Planned  Pending:   Pending further evaluation   Under consideration:   ***   Completed:   None at this time   Therapeutic  Palliative (PRN) options:   None established      Provider-requested follow-up: No follow-ups on file.  Future Appointments  Date Time Provider [place]  09/04/2021  9:00 AM Milinda Pointer, MD [facility] None    Note by: Gaspar Cola, MD Date: 09/04/2021; Time: 3:03 PM

## 2021-09-04 ENCOUNTER — Encounter: Payer: Self-pay | Admitting: Licensed Practical Nurse

## 2021-09-04 ENCOUNTER — Other Ambulatory Visit: Payer: Self-pay | Admitting: Licensed Practical Nurse

## 2021-09-04 ENCOUNTER — Other Ambulatory Visit: Payer: Self-pay | Admitting: Pain Medicine

## 2021-09-04 ENCOUNTER — Other Ambulatory Visit: Payer: Self-pay

## 2021-09-04 ENCOUNTER — Ambulatory Visit (HOSPITAL_BASED_OUTPATIENT_CLINIC_OR_DEPARTMENT_OTHER): Payer: BC Managed Care – PPO | Admitting: Pain Medicine

## 2021-09-04 ENCOUNTER — Telehealth: Payer: Self-pay | Admitting: Licensed Practical Nurse

## 2021-09-04 ENCOUNTER — Ambulatory Visit
Admission: RE | Admit: 2021-09-04 | Discharge: 2021-09-04 | Disposition: A | Discharge status: Home / Self Care | Payer: BC Managed Care – PPO | Source: Ambulatory Visit | Attending: Pain Medicine | Admitting: Pain Medicine

## 2021-09-04 ENCOUNTER — Encounter: Payer: Self-pay | Admitting: Pain Medicine

## 2021-09-04 VITALS — BP 144/93 | HR 79 | Temp 97.4°F | Resp 16 | Ht 59.0 in | Wt 165.0 lb

## 2021-09-04 DIAGNOSIS — Z853 Personal history of malignant neoplasm of breast: Secondary | ICD-10-CM | POA: Insufficient documentation

## 2021-09-04 DIAGNOSIS — M79641 Pain in right hand: Secondary | ICD-10-CM | POA: Insufficient documentation

## 2021-09-04 DIAGNOSIS — M2550 Pain in unspecified joint: Secondary | ICD-10-CM | POA: Insufficient documentation

## 2021-09-04 DIAGNOSIS — M79642 Pain in left hand: Secondary | ICD-10-CM

## 2021-09-04 DIAGNOSIS — M546 Pain in thoracic spine: Secondary | ICD-10-CM

## 2021-09-04 DIAGNOSIS — G8929 Other chronic pain: Secondary | ICD-10-CM | POA: Insufficient documentation

## 2021-09-04 DIAGNOSIS — M79601 Pain in right arm: Secondary | ICD-10-CM | POA: Insufficient documentation

## 2021-09-04 DIAGNOSIS — M25512 Pain in left shoulder: Secondary | ICD-10-CM | POA: Insufficient documentation

## 2021-09-04 DIAGNOSIS — G894 Chronic pain syndrome: Secondary | ICD-10-CM

## 2021-09-04 DIAGNOSIS — M79604 Pain in right leg: Secondary | ICD-10-CM | POA: Insufficient documentation

## 2021-09-04 DIAGNOSIS — G8928 Other chronic postprocedural pain: Secondary | ICD-10-CM

## 2021-09-04 DIAGNOSIS — Z79899 Other long term (current) drug therapy: Secondary | ICD-10-CM | POA: Insufficient documentation

## 2021-09-04 DIAGNOSIS — G893 Neoplasm related pain (acute) (chronic): Secondary | ICD-10-CM | POA: Diagnosis not present

## 2021-09-04 DIAGNOSIS — M79602 Pain in left arm: Secondary | ICD-10-CM | POA: Insufficient documentation

## 2021-09-04 DIAGNOSIS — M792 Neuralgia and neuritis, unspecified: Secondary | ICD-10-CM | POA: Insufficient documentation

## 2021-09-04 DIAGNOSIS — M79672 Pain in left foot: Secondary | ICD-10-CM

## 2021-09-04 DIAGNOSIS — M79671 Pain in right foot: Secondary | ICD-10-CM | POA: Insufficient documentation

## 2021-09-04 DIAGNOSIS — M545 Low back pain, unspecified: Secondary | ICD-10-CM

## 2021-09-04 DIAGNOSIS — M47817 Spondylosis without myelopathy or radiculopathy, lumbosacral region: Secondary | ICD-10-CM | POA: Insufficient documentation

## 2021-09-04 DIAGNOSIS — M5137 Other intervertebral disc degeneration, lumbosacral region: Secondary | ICD-10-CM

## 2021-09-04 DIAGNOSIS — Z9013 Acquired absence of bilateral breasts and nipples: Secondary | ICD-10-CM | POA: Insufficient documentation

## 2021-09-04 DIAGNOSIS — N3000 Acute cystitis without hematuria: Secondary | ICD-10-CM

## 2021-09-04 DIAGNOSIS — G6282 Radiation-induced polyneuropathy: Secondary | ICD-10-CM | POA: Diagnosis present

## 2021-09-04 DIAGNOSIS — Y842 Radiological procedure and radiotherapy as the cause of abnormal reaction of the patient, or of later complication, without mention of misadventure at the time of the procedure: Secondary | ICD-10-CM

## 2021-09-04 DIAGNOSIS — M5136 Other intervertebral disc degeneration, lumbar region: Secondary | ICD-10-CM

## 2021-09-04 DIAGNOSIS — R079 Chest pain, unspecified: Secondary | ICD-10-CM | POA: Insufficient documentation

## 2021-09-04 DIAGNOSIS — M25511 Pain in right shoulder: Secondary | ICD-10-CM | POA: Insufficient documentation

## 2021-09-04 DIAGNOSIS — E063 Autoimmune thyroiditis: Secondary | ICD-10-CM | POA: Insufficient documentation

## 2021-09-04 DIAGNOSIS — R937 Abnormal findings on diagnostic imaging of other parts of musculoskeletal system: Secondary | ICD-10-CM | POA: Insufficient documentation

## 2021-09-04 DIAGNOSIS — G629 Polyneuropathy, unspecified: Secondary | ICD-10-CM | POA: Insufficient documentation

## 2021-09-04 DIAGNOSIS — R0789 Other chest pain: Secondary | ICD-10-CM | POA: Diagnosis not present

## 2021-09-04 DIAGNOSIS — M899 Disorder of bone, unspecified: Secondary | ICD-10-CM | POA: Insufficient documentation

## 2021-09-04 DIAGNOSIS — I890 Lymphedema, not elsewhere classified: Secondary | ICD-10-CM | POA: Insufficient documentation

## 2021-09-04 DIAGNOSIS — M48061 Spinal stenosis, lumbar region without neurogenic claudication: Secondary | ICD-10-CM | POA: Insufficient documentation

## 2021-09-04 DIAGNOSIS — M51369 Other intervertebral disc degeneration, lumbar region without mention of lumbar back pain or lower extremity pain: Secondary | ICD-10-CM

## 2021-09-04 DIAGNOSIS — M51379 Other intervertebral disc degeneration, lumbosacral region without mention of lumbar back pain or lower extremity pain: Secondary | ICD-10-CM

## 2021-09-04 DIAGNOSIS — E559 Vitamin D deficiency, unspecified: Secondary | ICD-10-CM | POA: Insufficient documentation

## 2021-09-04 DIAGNOSIS — Z789 Other specified health status: Secondary | ICD-10-CM

## 2021-09-04 DIAGNOSIS — R9389 Abnormal findings on diagnostic imaging of other specified body structures: Secondary | ICD-10-CM | POA: Insufficient documentation

## 2021-09-04 DIAGNOSIS — K0889 Other specified disorders of teeth and supporting structures: Secondary | ICD-10-CM | POA: Insufficient documentation

## 2021-09-04 DIAGNOSIS — R350 Frequency of micturition: Secondary | ICD-10-CM | POA: Insufficient documentation

## 2021-09-04 DIAGNOSIS — R52 Pain, unspecified: Secondary | ICD-10-CM

## 2021-09-04 DIAGNOSIS — E785 Hyperlipidemia, unspecified: Secondary | ICD-10-CM | POA: Insufficient documentation

## 2021-09-04 MED ORDER — PREGABALIN 25 MG PO CAPS: ORAL_CAPSULE | ORAL | 0 refills | Status: DC

## 2021-09-04 MED ORDER — NITROFURANTOIN MONOHYD MACRO 100 MG PO CAPS: 100.0000 mg | ORAL_CAPSULE | Freq: Two times a day (BID) | ORAL | 1 refills | Status: AC

## 2021-09-04 NOTE — Progress Notes (Unsigned)
Safety precautions to be maintained throughout the outpatient stay will include: orient to surroundings, keep bed in low position, maintain call bell within reach at all times, provide assistance with transfer out of bed and ambulation.  

## 2021-09-04 NOTE — Progress Notes (Signed)
Urine culture shows UTI, script for Macrobid sent.   Attempted to call pt, but mailbox is full.  Mychart message sent. Roberto Scales, CNM  [facility], [facility]  09/04/21  10:51 AM

## 2021-09-04 NOTE — Telephone Encounter (Signed)
Caroline Lamb returned phone call [name] did see her results in [place] Reviewed pap results, you do not further testing, you should repeat your pap in 3 years You do have a UTI, please pick up your prescriptions Sae asked about a pelvic US she had in 2021 (she was looking through her mychart saw the results and nobody ever called her-was being seen at [facility] at that time), results noted a endometrial polyp. Rec seeing Dr Amalia Hailey to review [place], her symptoms of pelvic pain and her questions in regards to potential hysterectomy.  Pt to call and schedule apt with Dr Christa See, CNM  [facility], [facility]  '@TODAY'$ @  12:03 PM

## 2021-09-06 DIAGNOSIS — G893 Neoplasm related pain (acute) (chronic): Secondary | ICD-10-CM | POA: Insufficient documentation

## 2021-09-06 DIAGNOSIS — G8929 Other chronic pain: Secondary | ICD-10-CM | POA: Insufficient documentation

## 2021-09-07 LAB — VITAMIN B12: Vitamin B-12: 611 pg/mL (ref 232–1245)

## 2021-09-07 LAB — C-REACTIVE PROTEIN: CRP: <1 mg/L (ref 0–10)

## 2021-09-07 LAB — 25-HYDROXY VITAMIN D LCMS D2+D3
25-Hydroxy, Vitamin D-2: <1 ng/mL
25-Hydroxy, Vitamin D-3: 37 ng/mL
25-Hydroxy, Vitamin D: 37 ng/mL

## 2021-09-07 LAB — MAGNESIUM: Magnesium: 2.2 mg/dL (ref 1.6–2.3)

## 2021-09-07 LAB — SEDIMENTATION RATE: Sed Rate: 19 mm/hr (ref 0–40)

## 2021-09-08 LAB — COMPLIANCE DRUG ANALYSIS, UR

## 2021-09-11 ENCOUNTER — Ambulatory Visit: Payer: BC Managed Care – PPO | Admitting: Pain Medicine

## 2021-09-11 ENCOUNTER — Encounter: Payer: Self-pay | Admitting: Pain Medicine

## 2021-09-11 DIAGNOSIS — R892 Abnormal level of other drugs, medicaments and biological substances in specimens from other organs, systems and tissues: Secondary | ICD-10-CM | POA: Insufficient documentation

## 2021-09-26 DIAGNOSIS — Z1371 Encounter for nonprocreative screening for genetic disease carrier status: Secondary | ICD-10-CM

## 2021-09-26 HISTORY — DX: Encounter for nonprocreative screening for genetic disease carrier status: Z13.71

## 2021-10-01 NOTE — Progress Notes (Signed)
PROVIDER NOTE: Information contained herein reflects review and annotations entered in association with encounter. Interpretation of such information and data should be left to medically-trained personnel. Information provided to patient can be located elsewhere in the medical record under "Patient Instructions". Document created using STT-dictation technology, any transcriptional errors that may result from process are unintentional.    Patient: Caroline Lamb  Service Category: E/M  Provider: Gaspar Cola, MD  DOB: Nov 28, 1960  DOS: 10/02/2021  Referring Provider: Teodora Medici, DO  MRN: [id number]  Specialty: Interventional Pain Management  PCP: Teodora Medici, DO  Type: Established Patient  Setting: Ambulatory outpatient    Location: Office  Delivery: Face-to-face     Primary Reason(s) for Visit: Encounter for evaluation before starting new chronic pain management plan of care (Level of risk: moderate) CC: Chest Pain, Foot Pain (bilateral), and Muscle Pain  HPI  Caroline Lamb is a 61 y.o. year old, female patient, who comes today for a follow-up evaluation to review the test results and decide on a treatment plan. She has Urinary frequency; Hyperlipidemia; Neutropenic fever ([place]); History of breast cancer; Hypothyroidism; Gastroesophageal reflux disease with esophagitis without hemorrhage; Hypothyroidism due to Hashimoto's thyroiditis; Invasive ductal carcinoma of breast, female, left ([place]); Low vitamin D level; Malignant neoplasm of lower-outer quadrant of left breast of female, estrogen receptor positive ([place]); Thickened endometrium; Chronic pain syndrome; Pharmacologic therapy; Disorder of skeletal system; Problems influencing health status; Abnormal MRI, lumbar spine (07/25/2017); Lumbosacral facet hypertrophy (Multilevel) (Bilateral); DDD (degenerative disc disease), lumbosacral; Annular tear of lumbar disc (Right: L2-3); Pain after radiation therapy; 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;  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[place]); Chronic thoracic back pain (Bilateral); Chest pain not due to acute coronary syndrome (Bilateral) (L>R); 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); History of bilateral mastectomy; Chronic low back pain (3ry area of Pain) (Bilateral) w/o sciatica; Chronic lower extremity pain (Right); Generalized diffuse arthralgia (4th area of Pain); Chronic feet pain (Bilateral); Chronic shoulder pain (Bilateral); Chronic upper extremity pain (Bilateral); Chronic hand pain (Bilateral); Chronic chest wall pain (1ry area of Pain) (Bilateral) (L>R); Chronic pain after cancer treatment; Abnormal drug screen (09/04/2021); Chronic ankle pain (2ry area of Pain) (Bilateral); Generalized muscle ache (5th area of Pain); Situational depression; and Situational anxiety on their problem list. Her primarily concern today is the Chest Pain, Foot Pain (bilateral), and Muscle Pain  Pain Assessment: Location:   Chest Radiating: states pain all over her body Onset: More than a month ago Duration: Chronic pain Quality: Discomfort, Tingling, Numbness, Constant, Burning, Sharp, Stabbing Severity: 5 /10 (subjective, self-reported pain score)  Effect on ADL: Limits activities Timing: Constant Modifying factors: nothing BP: 129/78  HR: 79  Caroline Lamb comes in today for a follow-up visit after her initial evaluation on 09/04/2021. Today we went over the results of her tests. These were explained in "Layman's terms". During today's appointment we went over my diagnostic impression, as well as the proposed treatment plan.  Review of initial evaluation (09/04/2021): "Prior pain management records (08/05/2019) Barnet [place], MD): "female with a PMHx of hypothyroidism and ER+/PR-/HER2+ left breast cancer. [facility] regarding chest wall pain s/p bilateral mastectomy and chemotherapy in 2019. Pain is worse on the left side but is present bilaterally.  ...patient localizes pain to the sided chest wall that began following her bilateral mastectomy and chemotherapy in 2019. Her pain appears neuropathic in nature.It affects her mood, sleep and ability to effectively work though she continues to do so.  She has undergone PT in the past and continues to perform exercises/stretches at home with benefit."  According to the patient the primary area of pain is that of the anterior chest wall (Bilateral) (L>R) where she had her bilateral total mastectomy for the treatment of her left breast cancer.  She underwent chemotherapy and radiation therapy, especially on the left side which corresponds to the area that she refers hurts the most.  According to her she was diagnosed with cancer approximately 3 years ago and this is when she underwent her mastectomy (s/p mastectomy).  She has continued to be followed for the cancer at the [facility] oncology department and so far all of her tests have been negative.  Unfortunately, she appears to have developed a possible radiation neuropathy that appears to affect primarily at the left side of her chest.  This neuropathy appears to be affecting some of the intercostal nerves and she also refers having some pain under her left axilla.  She has bilateral upper extremity lymphedema secondary to the bilateral total mastectomies, but apparently she has a machine at home that she uses on a regular basis to treat this lymphedema.  She refers having being seen at a pain management practice in [place] where she was given a prescription for nortriptyline which according to her she used for a while, but not only did not not help the pain, but according to her it made it worse.  For this reason she discontinued the use of this medicine.  She denies having tried any gabapentin (Neurontin) or pregabalin (Lyrica).  She is originally from [place] and she still has a very thick accent which makes it difficult to understand.  According to her she has an uncle  in [place] that is a physician and he recommended trying some of the Lyrica.  She has voiced not being interested in any opioid analgesics or anything else that could be addictive.  She is also very concerned about medications that may interact with her cancer, that would make her gain more weight.  The patient's secondary area pain is that of generalized arthralgias affecting all of her joints including those of her hand.  She also feels that she has some generalized myalgias as well.  The patient's third area pain is that of her ankles (Bilateral) where she has apparently had some lipomas removed from the area behind the lateral malleolus.  She has some clearly visible scars that in fact would suggest that the patient has some keloid.  The patient's fourth area pain is that of her lower back (Bilateral).  The patient did not go in a lot of detail into any specific problems indicating that her pain was "all over".  Anytime that I attempted to narrow it down and get some specific information about an area pain she would again indicate that it was "all over".  Of course, this is not very helpful when it comes to evaluating specific areas for treatment."  Today we went over the results of the patient's lab work and imaging. Thoracic x-rays: "Mild degenerative changes with no fracture identified."  Mild intervertebral to space narrowing in the (mid thoracic spine. Left foot x-rays: "Mild degenerative changes in the greater toe MCP joint."  Small calcaneal spurs. Right foot x-rays: "Mild to moderate degenerative changes in the great toe IP and MCP joints."  Small calcaneal spurs. Right hand x-rays: "Mild degenerative changes at the DIP joints." Left hand x-rays: "Negative" Left shoulder x-rays: "Negative" Right shoulder x-rays: "Negative" Lumbar  spine x-rays with bending: "Mild to moderate multilevel degenerative disc disease in the lumbar spine."  Review of pregabalin (Lyrica) trial: She indicates  having stopped the pregabalin secondary to depression where she indicates that she spent 3 days crying.  In addition she also indicated having more lower extremity bruising than usual.  These 2 are not known to be "typical" side effects of the pregabalin however she also complained of "mental fog", cognitive impairment, memory impairment, and she also was concerned about driving on these medications.  For this reason she stopped it.  She indicated being willing to try it again now that she is not working, but she also indicated that she did not feel like it helped while she was on it.  Today we had a long conversation with regards to her UDS.  She is taking CBD and she actually brought the bottle in for me to see it.  She also indicated that she had informed the nursing staff that she was taking it.  However, she also indicated that it did not seem to help that much and therefore she refers having stopped it about 2 weeks ago.  Since she did stop it, then I do not need to do any CBD/THC testing.  In any case, she had a lot of questions with regards to the medicine all of which I have answered.  When the time came to tell the patient the alternatives for her treatment, I informed her the possibility of using alpha lipoid acid.  I sent a prescription to her pharmacy but I reminded her that the medication is over-the-counter and therefore if it does work for her, all she needs to do is to discontinue buying it and taking it.  I told her to try 600 mg at bedtime and see if that helps.  I also told her that she needs to try it for at least 2 weeks to determine if it is going to help.  I also informed the patient about other options, such as a left T5-6 thoracic epidural steroid injection, but she indicated that she would like to stay away from steroids.  I also informed her about the possibility of doing some diagnostic left intercostal nerve blocks with local anesthetic only.  I informed the patient that it is a  diagnostic block and therefore it is not meant to provide her with long-term benefit.  I did explain to her about the local anesthetic and what we would be looking for during the procedure.  However, I also informed the patient that should it control the pain for the duration of local anesthetic, there was the option of following up with radiofrequency ablation.  She asked about possible side effects and I informed her that because we are placing a needle close to the lung, there is always a possibility of puncturing the lung.  At this point the plan is for her to try the ALA and see if it works.  I will see her back in 2 weeks for follow-up and if it does not provide her with any relief of the pain then we will move onto the intercostal nerve blocks.  Controlled Substance Pharmacotherapy Assessment REMS (Risk Evaluation and Mitigation Strategy)  Opioid Analgesic: None MME/day: 0 mg/day  Pill Count: None expected due to no prior prescriptions written by our practice. Dewayne Shorter, RN  10/02/2021 10:06 AM  Sign when Signing Visit Safety precautions to be maintained throughout the outpatient stay will include: orient to surroundings, keep  bed in low position, maintain call bell within reach at all times, provide assistance with transfer out of bed and ambulation.   Pharmacokinetics: Liberation and absorption (onset of action): WNL Distribution (time to peak effect): WNL Metabolism and excretion (duration of action): WNL         Pharmacodynamics: Desired effects: Analgesia: Caroline Lamb reports >50% benefit. Functional ability: Patient reports that medication allows her to accomplish basic ADLs Clinically meaningful improvement in function (CMIF): Sustained CMIF goals met Perceived effectiveness: Described as relatively effective, allowing for increase in activities of daily living (ADL) Undesirable effects: Side-effects or Adverse reactions: None reported Monitoring: [place] PMP: PDMP reviewed during this  encounter. Online review of the past 50-monthperiod previously conducted. Not applicable at this point since we have not taken over the patient's medication management yet. List of other Serum/Urine Drug Screening Test(s):  No results found for: "AMPHSCRSER", "BARBSCRSER", "BENZOSCRSER", "COCAINSCRSER", "COCAINSCRNUR", "PCPSCRSER", "THCSCRSER", "THCU", "CANNABQUANT", "OPIATESCRSER", "OXYSCRSER", "PROPOXSCRSER", "ETH", "CBDTHCR", "D8THCCBX", "D9THCCBX" List of all UDS test(s) done:  Lab Results  Component Value Date   SUMMARY Note 09/04/2021   Last UDS on record: Summary  Date Value Ref Range Status  09/04/2021 Note  Final    Comment:    ==================================================================== Compliance Drug Analysis, Ur ==================================================================== Test                             Result       Flag       Units  Drug Present not Declared for Prescription Verification   Carboxy-THC                    7            UNEXPECTED ng/mg creat    Carboxy-THC is a metabolite of tetrahydrocannabinol (THC). Source of    THC is most commonly herbal marijuana or marijuana-based products,    but THC is also present in a scheduled prescription medication.    Trace amounts of THC can be present in hemp and cannabidiol (CBD)    products. This test is not intended to distinguish between delta-9-    tetrahydrocannabinol, the predominant form of THC in most herbal or    marijuana-based products, and delta-8-tetrahydrocannabinol.    Naproxen                       PRESENT      UNEXPECTED  Drug Absent but Declared for Prescription Verification   Pregabalin                     Not Detected UNEXPECTED   Acetaminophen                  Not Detected UNEXPECTED    Acetaminophen, as indicated in the declared medication list, is not    always detected even when used as directed.  ==================================================================== Test                       Result    Flag   Units      Ref Range   Creatinine              44               mg/dL      >=20 ==================================================================== Declared Medications:  The flagging and interpretation on this report are based on the  following declared  medications.  Unexpected results may arise from  inaccuracies in the declared medications.   **Note: The testing scope of this panel includes these medications:   Pregabalin (Lyrica)   **Note: The testing scope of this panel does not include small to  moderate amounts of these reported medications:   Acetaminophen (Tylenol)   **Note: The testing scope of this panel does not include the  following reported medications:   Cholecalciferol  Exemestane (Aromasin)  Eye Drop  Levofloxacin (Levaquin)  Levothyroxine (Synthroid)  Liothyronine (Cytomel)  Nitrofurantoin (Macrobid) ==================================================================== For clinical consultation, please call [phone]. ====================================================================    UDS interpretation: No unexpected findings.          Medication Assessment Form: Not applicable. No opioids. Treatment compliance: Not applicable Risk Assessment Profile: Aberrant behavior: See initial evaluations. None observed or detected today Comorbid factors increasing risk of overdose: See initial evaluation. No additional risks detected today Opioid risk tool (ORT):     09/04/2021    9:13 AM  Opioid Risk   Alcohol 0  Illegal Drugs 0  Rx Drugs 4  Alcohol 0  Illegal Drugs 0  Rx Drugs 0  Psychological Disease 2  ADD Negative  OCD Negative  Bipolar Negative  Depression 1  Opioid Risk Tool Scoring 7  Opioid Risk Interpretation Moderate Risk    ORT Scoring interpretation table:  Score <3 = Low Risk for SUD  Score between 4-7 = Moderate Risk for SUD  Score >8 = High Risk for Opioid Abuse   Risk of substance use  disorder (SUD): Moderate-to-High  Risk Mitigation Strategies:  Patient opioid safety counseling: No controlled substances prescribed. Patient-Prescriber Agreement (PPA): No agreement signed.  Controlled substance notification to other providers: None required. No opioid therapy.  Pharmacologic Plan: Non-opioid analgesic therapy offered. Interventional alternatives discussed.             Laboratory Chemistry Profile   Renal Lab Results  Component Value Date   BUN 17 08/08/2021   CREATININE 0.47 (L) 08/08/2021   BCR 36 (H) 08/08/2021   GFRAA >60 11/22/2017   GFRNONAA >60 05/08/2021   SPECGRAV 1.025 08/22/2021   PHUR 5.0 08/22/2021   PROTEINUR Negative 08/22/2021     Electrolytes Lab Results  Component Value Date   NA 140 08/08/2021   K 4.6 08/08/2021   CL 103 08/08/2021   CALCIUM 9.5 08/08/2021   MG 2.2 09/04/2021   PHOS 3.3 12/09/2020     Hepatic Lab Results  Component Value Date   AST 17 08/08/2021   ALT 19 08/08/2021   ALBUMIN 4.3 05/08/2021   ALKPHOS 61 05/08/2021   LIPASE 28 05/08/2021     ID Lab Results  Component Value Date   HIV Non Reactive 09/26/2017   SARSCOV2NAA POSITIVE (A) 05/08/2021   PREGTESTUR NEGATIVE 01/25/2017     Bone Lab Results  Component Value Date   VD25OH 32 08/08/2021   25OHVITD1 37 09/04/2021   25OHVITD2 <1.0 09/04/2021   25OHVITD3 37 09/04/2021     Endocrine Lab Results  Component Value Date   GLUCOSE 108 (H) 08/08/2021   GLUCOSEU NEGATIVE 05/08/2021   TSH 4.81 (H) 08/08/2021     Neuropathy Lab Results  Component Value Date   ZHYQMVHQ46 962 09/04/2021   HIV Non Reactive 09/26/2017     CNS No results found for: "COLORCSF", "APPEARCSF", "RBCCOUNTCSF", "WBCCSF", "POLYSCSF", "LYMPHSCSF", "EOSCSF", "PROTEINCSF", "GLUCCSF", "JCVIRUS", "CSFOLI", "IGGCSF", "LABACHR", "ACETBL"   Inflammation (CRP: Acute  ESR: Chronic) Lab Results  Component Value Date   CRP <  1 09/04/2021   ESRSEDRATE 19 09/04/2021   LATICACIDVEN 1.0  05/08/2021     Rheumatology No results found for: "RF", "ANA", "LABURIC", "URICUR", "LYMEIGGIGMAB", "LYMEABIGMQN", "HLAB27"   Coagulation Lab Results  Component Value Date   INR 0.9 08/08/2021   LABPROT 9.6 08/08/2021   PLT 254 08/08/2021     Cardiovascular Lab Results  Component Value Date   TROPONINI < 0.02 11/10/2012   HGB 14.2 08/08/2021   HCT 43.0 08/08/2021     Screening Lab Results  Component Value Date   SARSCOV2NAA POSITIVE (A) 05/08/2021   HIV Non Reactive 09/26/2017   PREGTESTUR NEGATIVE 01/25/2017     Cancer No results found for: "CEA", "CA125", "LABCA2"   Allergens No results found for: "ALMOND", "APPLE", "ASPARAGUS", "AVOCADO", "BANANA", "BARLEY", "BASIL", "BAYLEAF", "GREENBEAN", "LIMABEAN", "WHITEBEAN", "BEEFIGE", "REDBEET", "BLUEBERRY", "BROCCOLI", "CABBAGE", "MELON", "CARROT", "CASEIN", "CASHEWNUT", "CAULIFLOWER", "CELERY"     Note: Lab results reviewed.  Recent Diagnostic Imaging Review  Shoulder Imaging: Shoulder-R DG: Results for orders placed during the hospital encounter of 09/04/21 DG Shoulder Right  Narrative CLINICAL DATA:  Chronic pain  EXAM: RIGHT SHOULDER - 2+ VIEW  COMPARISON:  None Available.  FINDINGS: There is no evidence of fracture or dislocation. There is no evidence of arthropathy or other focal bone abnormality. Soft tissues are unremarkable.  IMPRESSION: Negative.   Electronically Signed By: Audie Pinto M.D. On: 09/04/2021 13:12  Shoulder-L DG: Results for orders placed during the hospital encounter of 09/04/21 DG Shoulder Left  Narrative CLINICAL DATA:  Chronic pain  EXAM: LEFT SHOULDER - 2+ VIEW  COMPARISON:  None Available.  FINDINGS: There is no evidence of fracture or dislocation. There is no evidence of arthropathy or other focal bone abnormality. Soft tissues are unremarkable.  IMPRESSION: Negative.   Electronically Signed By: Audie Pinto M.D. On: 09/04/2021 13:16  Lumbosacral  Imaging: Lumbar MR wo contrast: Results for orders placed during the hospital encounter of 07/24/17 MR LUMBAR SPINE WO CONTRAST  Narrative CLINICAL DATA:  61 y/o F; back pain radiating to bilateral mid thighs.  EXAM: MRI LUMBAR SPINE WITHOUT CONTRAST  TECHNIQUE: Multiplanar, multisequence MR imaging of the lumbar spine was performed. No intravenous contrast was administered.  COMPARISON:  07/25/2017 CT abdomen and pelvis.  FINDINGS: Segmentation:  Standard.  Alignment:  Physiologic.  Vertebrae: No fracture, evidence of discitis, or bone lesion. Mild right-sided L4-5 facet edema, likely degenerative.  Conus medullaris and cauda equina: Conus extends to the L1 level. Fatty filum. Linear increased signal within the central conus medullaris, likely a ventriculus terminalis. S2 level Tarlov cysts.  Paraspinal and other soft tissues: Negative.  Disc levels:  L1-2: No significant disc displacement, foraminal stenosis, or canal stenosis.  L2-3: Mild disc bulge and facet hypertrophy. Small right subarticular annular fissure. No significant foraminal or canal stenosis.  L3-4: Mild disc bulge and facet hypertrophy. No significant foraminal or canal stenosis.  L4-5: Mild disc bulge and small central disc protrusion with mild facet hypertrophy. No significant foraminal or canal stenosis.  L5-S1: Mild disc bulge and facet hypertrophy. No significant foraminal or canal stenosis.  IMPRESSION: 1. Mild right L4-5 degenerative facet edema. 2. Fatty filum and ventriculus terminalis. 3. Mild multilevel disc and facet degenerative changes from L2-3 through L5-S1. No significant foraminal or canal stenosis.   Electronically Signed By: Kristine Garbe M.D. On: 07/25/2017 03:13  Lumbar DG Bending views: Results for orders placed during the hospital encounter of 09/04/21 DG Lumbar Spine Complete W/Bend  Narrative CLINICAL DATA:  Chronic lower  back pain  EXAM: LUMBAR  SPINE - COMPLETE WITH BENDING VIEWS  COMPARISON:  CT lumbar spine 07/25/2017  FINDINGS: There is minimal retrolisthesis of L2 on L3 and L3 on L4, likely on a degenerative basis. Otherwise alignment intact. No listhesis on flexion extension. There is mild to moderate multilevel intervertebral disc space loss with endplate spurring. No focal bone lesion. SI joints are open and symmetric. Nonobstructive bowel gas pattern.  IMPRESSION: No acute finding. Mild to moderate multilevel degenerative disc disease in the lumbar spine.   Electronically Signed By: Audie Pinto M.D. On: 09/04/2021 13:15  Foot Imaging: Foot-R DG Complete: Results for orders placed during the hospital encounter of 09/04/21 DG Foot Complete Right  Narrative CLINICAL DATA:  Chronic pain  EXAM: RIGHT FOOT COMPLETE - 3+ VIEW  COMPARISON:  None Available.  FINDINGS: There is no evidence of fracture or dislocation. There is no evidence of arthropathy or other focal bone abnormality. Mild to moderate degenerative changes at the great toe IP and MCP joints. Small calcaneal spurs. Soft tissues are unremarkable.  IMPRESSION: 1. No acute osseous abnormality in the right foot. 2. Mild to moderate degenerative changes in the great toe.   Electronically Signed By: Audie Pinto M.D. On: 09/04/2021 13:21  Foot-L DG Complete: Results for orders placed during the hospital encounter of 09/04/21 DG Foot Complete Left  Narrative CLINICAL DATA:  Chronic pain  EXAM: LEFT FOOT - COMPLETE 3+ VIEW  COMPARISON:  None Available.  FINDINGS: There is no evidence of fracture or dislocation. There is no evidence of arthropathy or other focal bone abnormality. Mild degenerative changes in the great toe MCP joint. Small calcaneal spurs. Soft tissues are unremarkable.  IMPRESSION: 1. No acute osseous abnormality in the left foot. 2. Mild degenerative changes in the great toe MCP joint.   Electronically  Signed By: Audie Pinto M.D. On: 09/04/2021 13:22  Hand Imaging: Hand-R DG Complete: Results for orders placed during the hospital encounter of 09/04/21 DG Hand Complete Right  Narrative CLINICAL DATA:  Chronic pain  EXAM: RIGHT HAND - COMPLETE 3+ VIEW  COMPARISON:  None Available.  FINDINGS: There is no evidence of fracture or dislocation. There is no evidence of arthropathy or other focal bone abnormality. Mild degenerative changes at the DIP joints. Soft tissues are unremarkable.  IMPRESSION: No acute finding.  Mild degenerative changes.   Electronically Signed By: Audie Pinto M.D. On: 09/04/2021 13:18  Hand-L DG Complete: Results for orders placed during the hospital encounter of 09/04/21 DG Hand Complete Left  Narrative CLINICAL DATA:  Chronic pain  EXAM: LEFT HAND - COMPLETE 3+ VIEW  COMPARISON:  None Available.  FINDINGS: There is no evidence of fracture or dislocation. There is no evidence of arthropathy or other focal bone abnormality. Soft tissues are unremarkable.  IMPRESSION: Negative.   Electronically Signed By: Audie Pinto M.D. On: 09/04/2021 13:17  Complexity Note: Imaging results reviewed.                         Meds   Current Outpatient Medications:    acetaminophen (TYLENOL) 500 MG tablet, Take 500-1,000 mg by mouth every 6 (six) hours as needed for mild pain., Disp: , Rfl:    Alpha-Lipoic Acid 600 MG CAPS, Take 1 capsule (600 mg total) by mouth daily., Disp: 30 capsule, Rfl: 0   CANNABIDIOL PO, Take by mouth., Disp: , Rfl:    Cholecalciferol (VITAMIN D3 PO), Take 2,000 Units by mouth  daily., Disp: , Rfl:    exemestane (AROMASIN) 25 MG tablet, Take 25 mg by mouth at bedtime. , Disp: , Rfl:    levothyroxine (SYNTHROID) 137 MCG tablet, Take 137 mcg by mouth daily., Disp: , Rfl:    liothyronine (CYTOMEL) 5 MCG tablet, Take 5 mcg by mouth daily before breakfast., Disp: , Rfl:    nitrofurantoin,  macrocrystal-monohydrate, (MACROBID) 100 MG capsule, Take 1 capsule (100 mg total) by mouth 2 (two) times daily., Disp: 14 capsule, Rfl: 1   Polyethyl Glycol-Propyl Glycol (LUBRICANT EYE DROPS) 0.4-0.3 % SOLN, Place 1 drop into both eyes 3 (three) times daily as needed (dry/irritated eyes.)., Disp: , Rfl:   ROS  Constitutional: Denies any fever or chills Gastrointestinal: No reported hemesis, hematochezia, vomiting, or acute GI distress Musculoskeletal: Denies any acute onset joint swelling, redness, loss of ROM, or weakness Neurological: No reported episodes of acute onset apraxia, aphasia, dysarthria, agnosia, amnesia, paralysis, loss of coordination, or loss of consciousness  Allergies  Caroline Lamb is allergic to contrast media [iodinated contrast media] and sulfa antibiotics.  [place]  Drug: Caroline Lamb  reports no history of drug use. Alcohol:  reports no history of alcohol use. Tobacco:  reports that she has never smoked. She has never used smokeless tobacco. Medical:  has a past medical history of Anxiety, Arthritis, Asthma, breast, Hyperlipidemia, Hypertension, and Hypothyroidism. Surgical: Caroline Lamb  has a past surgical history that includes Tonsillectomy; Diagnostic laparoscopy; cesarean; Colonoscopy with propofol (N/A, 02/07/2015); Tendon repair (Bilateral, 12/11/2019); Mass excision (Bilateral, 12/11/2019); Colonoscopy with propofol (N/A, 07/10/2021); Esophagogastroduodenoscopy (N/A, 07/10/2021); Tubal ligation; Cesarean section; and Mastectomy (Bilateral). Family: family history includes Hematuria in her father and mother; Prostate cancer in her father; Tuberculosis in her paternal grandfather.  Constitutional Exam  General appearance: Well nourished, well developed, and well hydrated. In no apparent acute distress Vitals:   10/02/21 1001  BP: 129/78  Pulse: 79  Resp: 16  Temp: (!) 97.3 F (36.3 C)  SpO2: 100%  Weight: 165 lb (74.8 kg)  Height: 4' 11"  (1.499 m)   BMI  Assessment: Estimated body mass index is 33.33 kg/m as calculated from the following:   Height as of this encounter: 4' 11"  (1.499 m).   Weight as of this encounter: 165 lb (74.8 kg).  BMI interpretation table: BMI level Category Range association with higher incidence of chronic pain  <18 kg/m2 Underweight   18.5-24.9 kg/m2 Ideal body weight   25-29.9 kg/m2 Overweight Increased incidence by 20%  30-34.9 kg/m2 Obese (Class I) Increased incidence by 68%  35-39.9 kg/m2 Severe obesity (Class II) Increased incidence by 136%  >40 kg/m2 Extreme obesity (Class III) Increased incidence by 254%   Patient's current BMI Ideal Body weight  Body mass index is 33.33 kg/m. Patient must be at least 60 in tall to calculate ideal body weight   BMI Readings from Last 4 Encounters:  10/02/21 33.33 kg/m  09/04/21 33.33 kg/m  08/22/21 33.33 kg/m  08/01/21 33.73 kg/m   Wt Readings from Last 4 Encounters:  10/02/21 165 lb (74.8 kg)  09/04/21 165 lb (74.8 kg)  08/22/21 165 lb (74.8 kg)  08/01/21 167 lb (75.8 kg)    Psych/Mental status: Alert, oriented x 3 (person, place, & time)       Eyes: PERLA Respiratory: No evidence of acute respiratory distress  Assessment & Plan  Primary Diagnosis & Pertinent Problem List: The primary encounter diagnosis was Chronic pain syndrome. 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  Visit Diagnosis: 1. Chronic pain syndrome   2. Chronic chest wall pain (1ry area of Pain) (Bilateral) (L>R)   3. History of breast cancer   4. History of bilateral mastectomy   5. 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 Pain after radiation therapy   9. Chronic ankle pain (2ry area of Pain) (Bilateral)   10. Chronic low back pain (3ry area of Pain) (Bilateral) w/o sciatica   11. Annular tear of lumbar disc (Right: L2-3)   12. Abnormal MRI, lumbar spine (07/25/2017)   13. Generalized diffuse arthralgia (4th area of Pain)   14. Generalized muscle ache (5th area of Pain)   15. Situational depression   16. Situational anxiety   17. Abnormal drug screen (09/04/2021)    Problems updated and reviewed during this visit: Problem  Chronic ankle pain (2ry area of Pain) (Bilateral)  Generalized muscle ache (5th area of Pain)  Chronic chest wall pain (1ry area of Pain) (Bilateral) (L>R)  Chronic low back pain (3ry area of Pain) (Bilateral) w/o sciatica  Generalized diffuse arthralgia (4th area of Pain)  Abnormal drug screen (09/04/2021)   (09/04/2021) abnormal UDS.  Positive for: THC (marijuana) CBD (Cannabidiol) user   Situational Depression  Situational Anxiety    Plan of Care  Pharmacotherapy (Medications Ordered): Meds ordered this encounter  Medications   Alpha-Lipoic Acid 600 MG CAPS    Sig: Take 1 capsule (600 mg total) by mouth daily.    Dispense:  30 capsule    Refill:  0    Do not place medication on "Automatic Refill". Fill one day early if pharmacy is closed on scheduled refill date.   Procedure Orders         INTERCOSTAL NERVE BLOCK     Lab Orders  No laboratory test(s) ordered today    Imaging Orders  No imaging studies ordered today   Referral Orders         Ambulatory referral to Psychology       Pharmacological management options:  Opioid Analgesics: I will not be prescribing any opioids at this time Membrane stabilizer: I will not be prescribing any at this time Muscle relaxant: I will not be prescribing any at this time NSAID: I will not be prescribing any at this time Other analgesic(s): I will not be prescribing any at this time      Interventional Therapies  Risk  Complexity Considerations:   Estimated body mass index is 33.33 kg/m as calculated from the following:   Height as of this encounter: 4' 11"  (1.499 m).   Weight as of this encounter: 165 lb (74.8 kg). WNL   Planned  Pending:   Pregabalin (Lyrica) titration trial.  Starting dose: 25 mg PO QD.  Instructions given to increase it to TID. (ALA) alpha lipoid acid trial    Under consideration:  (Prefers no steroids)  Consider alpha lipoid acid trial  Diagnostic/therapeutic left T5, T6 intercostal NB #1  (possible RFA f/u)  Diagnostic/therapeutic IV lidocaine infusion #1 (possible follow-up with oral mexiletine)    Completed:   None at this time   Therapeutic  Palliative (PRN) options:   None established    Provider-requested follow-up: Return in 2 weeks (on 10/16/2021) for Eval-day (M,W), (F2F), for medication review/adjustment. Recent Visits Date Type Provider Dept  09/04/21 Office Visit Milinda Pointer, MD [facility]  Showing recent visits within past 90 days and meeting all other requirements Today's Visits Date Type Provider Dept  10/02/21 Office Visit Milinda Pointer, MD [facility]  Showing today's visits and meeting all other requirements Future Appointments Date Type Provider Dept  10/18/21 Appointment Milinda Pointer, MD [facility]  Showing future appointments within next 90 days and meeting all other requirements  Primary Care Physician: Teodora Medici, DO Note by: Gaspar Cola, MD Date: 10/02/2021; Time: 11:30 AM

## 2021-10-02 ENCOUNTER — Ambulatory Visit: Payer: BC Managed Care – PPO | Attending: Pain Medicine | Admitting: Pain Medicine

## 2021-10-02 ENCOUNTER — Encounter: Payer: Self-pay | Admitting: Pain Medicine

## 2021-10-02 VITALS — BP 129/78 | HR 79 | Temp 97.3°F | Resp 16 | Ht 59.0 in | Wt 165.0 lb

## 2021-10-02 DIAGNOSIS — K2100 Gastro-esophageal reflux disease with esophagitis, without bleeding: Secondary | ICD-10-CM | POA: Diagnosis not present

## 2021-10-02 DIAGNOSIS — G894 Chronic pain syndrome: Secondary | ICD-10-CM | POA: Insufficient documentation

## 2021-10-02 DIAGNOSIS — M2550 Pain in unspecified joint: Secondary | ICD-10-CM | POA: Diagnosis not present

## 2021-10-02 DIAGNOSIS — M792 Neuralgia and neuritis, unspecified: Secondary | ICD-10-CM

## 2021-10-02 DIAGNOSIS — R0789 Other chest pain: Secondary | ICD-10-CM | POA: Diagnosis not present

## 2021-10-02 DIAGNOSIS — F418 Other specified anxiety disorders: Secondary | ICD-10-CM | POA: Diagnosis not present

## 2021-10-02 DIAGNOSIS — R350 Frequency of micturition: Secondary | ICD-10-CM | POA: Insufficient documentation

## 2021-10-02 DIAGNOSIS — E063 Autoimmune thyroiditis: Secondary | ICD-10-CM | POA: Diagnosis not present

## 2021-10-02 DIAGNOSIS — R937 Abnormal findings on diagnostic imaging of other parts of musculoskeletal system: Secondary | ICD-10-CM | POA: Diagnosis not present

## 2021-10-02 DIAGNOSIS — Y842 Radiological procedure and radiotherapy as the cause of abnormal reaction of the patient, or of later complication, without mention of misadventure at the time of the procedure: Secondary | ICD-10-CM

## 2021-10-02 DIAGNOSIS — G6282 Radiation-induced polyneuropathy: Secondary | ICD-10-CM

## 2021-10-02 DIAGNOSIS — M545 Low back pain, unspecified: Secondary | ICD-10-CM | POA: Diagnosis not present

## 2021-10-02 DIAGNOSIS — M5136 Other intervertebral disc degeneration, lumbar region: Secondary | ICD-10-CM | POA: Diagnosis not present

## 2021-10-02 DIAGNOSIS — M791 Myalgia, unspecified site: Secondary | ICD-10-CM | POA: Insufficient documentation

## 2021-10-02 DIAGNOSIS — Z853 Personal history of malignant neoplasm of breast: Secondary | ICD-10-CM | POA: Insufficient documentation

## 2021-10-02 DIAGNOSIS — M25571 Pain in right ankle and joints of right foot: Secondary | ICD-10-CM | POA: Insufficient documentation

## 2021-10-02 DIAGNOSIS — M25572 Pain in left ankle and joints of left foot: Secondary | ICD-10-CM | POA: Diagnosis not present

## 2021-10-02 DIAGNOSIS — E785 Hyperlipidemia, unspecified: Secondary | ICD-10-CM | POA: Insufficient documentation

## 2021-10-02 DIAGNOSIS — R52 Pain, unspecified: Secondary | ICD-10-CM

## 2021-10-02 DIAGNOSIS — Z9013 Acquired absence of bilateral breasts and nipples: Secondary | ICD-10-CM | POA: Insufficient documentation

## 2021-10-02 DIAGNOSIS — F4321 Adjustment disorder with depressed mood: Secondary | ICD-10-CM | POA: Diagnosis not present

## 2021-10-02 DIAGNOSIS — G8928 Other chronic postprocedural pain: Secondary | ICD-10-CM

## 2021-10-02 DIAGNOSIS — G629 Polyneuropathy, unspecified: Secondary | ICD-10-CM | POA: Insufficient documentation

## 2021-10-02 DIAGNOSIS — G8929 Other chronic pain: Secondary | ICD-10-CM

## 2021-10-02 DIAGNOSIS — R892 Abnormal level of other drugs, medicaments and biological substances in specimens from other organs, systems and tissues: Secondary | ICD-10-CM | POA: Insufficient documentation

## 2021-10-02 MED ORDER — ALPHA-LIPOIC ACID 600 MG PO CAPS: 600.0000 mg | ORAL_CAPSULE | Freq: Every day | ORAL | 0 refills | Status: AC

## 2021-10-02 NOTE — Patient Instructions (Signed)
______________________________________________________________________  Preparing for Procedure with Sedation  NOTICE: Due to recent regulatory changes, starting on September 26, 2020, procedures requiring intravenous (IV) sedation will no longer be performed at the [facility].  These types of procedures are required to be performed at [facility] ambulatory surgery facility.  We are very sorry for the inconvenience.  Procedure appointments are limited to planned procedures: No Prescription Refills. No disability issues will be discussed. No medication changes will be discussed.  Instructions: Oral Intake: Do not eat or drink anything for at least 8 hours prior to your procedure. (Exception: Blood Pressure Medication. See below.) Transportation: A driver is required. You may not drive yourself after the procedure. Blood Pressure Medicine: Do not forget to take your blood pressure medicine with a sip of water the morning of the procedure. If your Diastolic (lower reading) is above 100 mmHg, elective cases will be cancelled/rescheduled. Blood thinners: These will need to be stopped for procedures. Notify our staff if you are taking any blood thinners. Depending on which one you take, there will be specific instructions on how and when to stop it. Diabetics on insulin: Notify the staff so that you can be scheduled 1st case in the morning. If your diabetes requires high dose insulin, take only  of your normal insulin dose the morning of the procedure and notify the staff that you have done so. Preventing infections: Shower with an antibacterial soap the morning of your procedure. Build-up your immune system: Take 1000 mg of Vitamin C with every meal (3 times a day) the day prior to your procedure. Antibiotics: Inform the staff if you have a condition or reason that requires you to take antibiotics before dental procedures. Pregnancy: If you are pregnant, call and cancel the procedure. Sickness: If  you have a cold, fever, or any active infections, call and cancel the procedure. Arrival: You must be in the facility at least 30 minutes prior to your scheduled procedure. Children: Do not bring children with you. Dress appropriately: There is always the possibility that your clothing may get soiled. Valuables: Do not bring any jewelry or valuables.  Reasons to call and reschedule or cancel your procedure: (Following these recommendations will minimize the risk of a serious complication.) Surgeries: Avoid having procedures within 2 weeks of any surgery. (Avoid for 2 weeks before or after any surgery). Flu Shots: Avoid having procedures within 2 weeks of a flu shots. (Avoid for 2 weeks before or after immunizations). Barium: Avoid having a procedure within 7-10 days after having had a radiological study involving the use of radiological contrast. (Myelograms, Barium swallow or enema study). Heart attacks: Avoid any elective procedures or surgeries for the initial 6 months after a "Myocardial Infarction" (Heart Attack). Blood thinners: It is imperative that you stop these medications before procedures. Let us know if you if you take any blood thinner.  Infection: Avoid procedures during or within two weeks of an infection (including chest colds or gastrointestinal problems). Symptoms associated with infections include: Localized redness, fever, chills, night sweats or profuse sweating, burning sensation when voiding, cough, congestion, stuffiness, runny nose, sore throat, diarrhea, nausea, vomiting, cold or Flu symptoms, recent or current infections. It is specially important if the infection is over the area that we intend to treat. Heart and lung problems: Symptoms that may suggest an active cardiopulmonary problem include: cough, chest pain, breathing difficulties or shortness of breath, dizziness, ankle swelling, uncontrolled high or unusually low blood pressure, and/or palpitations. If you are    experiencing any of these symptoms, cancel your procedure and contact your primary care physician for an evaluation.  Remember:  Regular Business hours are:  Monday to Thursday 8:00 AM to 4:00 PM  Provider's Schedule: Kourosh Jablonsky, MD:  Procedure days: Tuesday and Thursday 7:30 AM to 4:00 PM  Bilal Lateef, MD:  Procedure days: Monday and Wednesday 7:30 AM to 4:00 PM ______________________________________________________________________  ____________________________________________________________________________________________  General Risks and Possible Complications  Patient Responsibilities: It is important that you read this as it is part of your informed consent. It is our duty to inform you of the risks and possible complications associated with treatments offered to you. It is your responsibility as a patient to read this and to ask questions about anything that is not clear or that you believe was not covered in this document.  Patient's Rights: You have the right to refuse treatment. You also have the right to change your mind, even after initially having agreed to have the treatment done. However, under this last option, if you wait until the last second to change your mind, you may be charged for the materials used up to that point.  Introduction: Medicine is not an exact science. Everything in Medicine, including the lack of treatment(s), carries the potential for danger, harm, or loss (which is by definition: Risk). In Medicine, a complication is a secondary problem, condition, or disease that can aggravate an already existing one. All treatments carry the risk of possible complications. The fact that a side effects or complications occurs, does not imply that the treatment was conducted incorrectly. It must be clearly understood that these can happen even when everything is done following the highest safety standards.  No treatment: You can choose not to proceed with the  proposed treatment alternative. The "PRO(s)" would include: avoiding the risk of complications associated with the therapy. The "CON(s)" would include: not getting any of the treatment benefits. These benefits fall under one of three categories: diagnostic; therapeutic; and/or palliative. Diagnostic benefits include: getting information which can ultimately lead to improvement of the disease or symptom(s). Therapeutic benefits are those associated with the successful treatment of the disease. Finally, palliative benefits are those related to the decrease of the primary symptoms, without necessarily curing the condition (example: decreasing the pain from a flare-up of a chronic condition, such as incurable terminal cancer).  General Risks and Complications: These are associated to most interventional treatments. They can occur alone, or in combination. They fall under one of the following six (6) categories: no benefit or worsening of symptoms; bleeding; infection; nerve damage; allergic reactions; and/or death. No benefits or worsening of symptoms: In Medicine there are no guarantees, only probabilities. No healthcare provider can ever guarantee that a medical treatment will work, they can only state the probability that it may. Furthermore, there is always the possibility that the condition may worsen, either directly, or indirectly, as a consequence of the treatment. Bleeding: This is more common if the patient is taking a blood thinner, either prescription or over the counter (example: Goody Powders, Fish oil, Aspirin, Garlic, etc.), or if suffering a condition associated with impaired coagulation (example: Hemophilia, cirrhosis of the liver, low platelet counts, etc.). However, even if you do not have one on these, it can still happen. If you have any of these conditions, or take one of these drugs, make sure to notify your treating physician. Infection: This is more common in patients with a compromised  immune system, either due to disease (example:   diabetes, cancer, human immunodeficiency virus [HIV], etc.), or due to medications or treatments (example: therapies used to treat cancer and rheumatological diseases). However, even if you do not have one on these, it can still happen. If you have any of these conditions, or take one of these drugs, make sure to notify your treating physician. Nerve Damage: This is more common when the treatment is an invasive one, but it can also happen with the use of medications, such as those used in the treatment of cancer. The damage can occur to small secondary nerves, or to large primary ones, such as those in the spinal cord and brain. This damage may be temporary or permanent and it may lead to impairments that can range from temporary numbness to permanent paralysis and/or brain death. Allergic Reactions: Any time a substance or material comes in contact with our body, there is the possibility of an allergic reaction. These can range from a mild skin rash (contact dermatitis) to a severe systemic reaction (anaphylactic reaction), which can result in death. Death: In general, any medical intervention can result in death, most of the time due to an unforeseen complication. ____________________________________________________________________________________________  

## 2021-10-02 NOTE — Progress Notes (Signed)
Safety precautions to be maintained throughout the outpatient stay will include: orient to surroundings, keep bed in low position, maintain call bell within reach at all times, provide assistance with transfer out of bed and ambulation.  

## 2021-10-03 ENCOUNTER — Telehealth: Payer: Self-pay

## 2021-10-03 ENCOUNTER — Telehealth: Payer: Self-pay | Admitting: *Deleted

## 2021-10-03 NOTE — Telephone Encounter (Signed)
Pharmacist told her she should not take alpha lipoic acid because she has thyroid disease.  Should she take?

## 2021-10-03 NOTE — Telephone Encounter (Signed)
Unfortunately, that is true.  Taking alpha lipoid acid seems to decrease well thyroid hormone works in the body.  Taking alpha lipoid acid with thyroid hormone might decrease the effectiveness of the thyroid hormone.  This is considered to be a minor drug interaction however the best thing to do is to talk to the physician that is managing her thyroid.  If they believe it not to be significant, then she can go ahead with a trial.  Patient called to ask if safe to take alpha lipoic acid with thyroid disease. Patient advised to also call physician who prescribes to ask this question.

## 2021-10-03 NOTE — Telephone Encounter (Signed)
She has some questions and wants a nurse to call her please

## 2021-10-11 LAB — DELTA-8 / DELTA-9 THC MTB,MS,U
Carboxy-Delta-8-THC: NOT DETECTED ng/mL
Carboxy-Delta-9-THC: NOT DETECTED ng/mL

## 2021-10-13 ENCOUNTER — Telehealth: Payer: Self-pay | Admitting: Pain Medicine

## 2021-10-13 LAB — CANNABIDIOL (CBD)/TETRAHYDROCANNABINOL (THC) RATIO, URINE: CBD:THC Ratio: >3.4 RATIO

## 2021-10-13 NOTE — Telephone Encounter (Signed)
Patient was told by the pharmacy the medication prescribed by Dr Dossie Arbour was not good for thyroid problems. She is going to talk to her pcp before starting them. She does not need the appt on 10-18-21 yet. She will call back to reschedule after that.

## 2021-10-13 NOTE — Telephone Encounter (Signed)
Thanks for letting us know. 

## 2021-10-18 ENCOUNTER — Encounter: Payer: BC Managed Care – PPO | Admitting: Pain Medicine

## 2021-10-24 ENCOUNTER — Ambulatory Visit (INDEPENDENT_AMBULATORY_CARE_PROVIDER_SITE_OTHER): Payer: BC Managed Care – PPO | Admitting: Obstetrics & Gynecology

## 2021-10-24 ENCOUNTER — Telehealth: Payer: Self-pay | Admitting: Pain Medicine

## 2021-10-24 ENCOUNTER — Encounter: Payer: Self-pay | Admitting: Obstetrics & Gynecology

## 2021-10-24 VITALS — BP 120/80 | Ht 59.0 in | Wt 168.0 lb

## 2021-10-24 DIAGNOSIS — Z8042 Family history of malignant neoplasm of prostate: Secondary | ICD-10-CM

## 2021-10-24 DIAGNOSIS — Z853 Personal history of malignant neoplasm of breast: Secondary | ICD-10-CM

## 2021-10-24 DIAGNOSIS — N941 Unspecified dyspareunia: Secondary | ICD-10-CM

## 2021-10-24 DIAGNOSIS — R87610 Atypical squamous cells of undetermined significance on cytologic smear of cervix (ASC-US): Secondary | ICD-10-CM | POA: Diagnosis not present

## 2021-10-24 DIAGNOSIS — R102 Pelvic and perineal pain: Secondary | ICD-10-CM

## 2021-10-24 NOTE — Telephone Encounter (Signed)
Patient is calling in reference to Alpha-Lipoic Acid 600 MG CAPS. Her endocrinologist wants to talk to Dr Dossie Arbour about this. The patient has Thyroid problems.   Colon Branch, MD  [phone]

## 2021-10-24 NOTE — Progress Notes (Signed)
   Subjective:    Patient ID: Caroline Lamb, female    DOB: 06-Sep-1960, 61 y.o.   MRN: [id number]  HPI 61 yo married P3 ([place] [employer]) here for discussion of her risks of cervical cancer. She had a ASCUS negative HR HPV pap 07/2021. She is requesting a hysterectomy due to her fear of cervical cancer. She was diagnosed with breast cancer (right) in 2019 and had a bilateral mastectomy. She was unable to tolerate chemotherapy and is taking aromasin 25 mg daily.  She reports that she would like genetic testing to see if she is a BRCA carrier (worried for the sake of her daughter). Her father had prostate cancer.   Review of Systems She reports some pain with sex, uses coconut oil. She has occasional pelvic pain.    Objective:   Physical Exam Well nourished, well hydrated Hispanic (born in [place]) female, no apparent distress She is ambulating and conversing normally. Vulva- significant vulvovaginal atrophy, small introitus Spec exam reveals normal cervix Bimanual exam reveals no masses. She seemed to have discomfort with the entire exam.     Assessment & Plan:  I have reassured her that her ASCUS negative HR HPV pap does not put her at risk for cervical cancer and that a hysterectomy would not be indicated based on this alone. I discussed the risk of hysterectomy and quoted the statistic that 5000 women per year die from this procedure.  I will happily order genetic testing and if she is BRCA positive, removing her adnexa would be recommended (and possibly the uterus could be removed at that time).  Pelvic pain- check gyn ultrasound  She agrees to this plan and seems more reassured.  I have recommended a repeat pap smear in a year.

## 2021-11-01 ENCOUNTER — Telehealth: Payer: Self-pay

## 2021-11-01 NOTE — Telephone Encounter (Signed)
Can someone follow up with him then? She is in pain, and he wanted her to take the medicine trial before doing a procedure. Since he hasn't called her endocrinologist to get clearance for her to take it, she has nothing for pain relief.

## 2021-11-01 NOTE — Telephone Encounter (Signed)
Message was sent to Dr Dossie Arbour last week.

## 2021-11-01 NOTE — Telephone Encounter (Signed)
The patient says she is not supposed to take the medicine that Dr. Dossie Arbour prescribed due to thyroid. She stated Dr. Dossie Arbour was supposed to call her endocrinologist and discuss if it is safe to take. She says no one has let her know so she has not been taking the medicine. Can someone call her to discuss this?

## 2021-11-01 NOTE — Telephone Encounter (Signed)
Called patient, I informed her that some of the questions that she has about her thyroid,that her endo md should be the one to make the decision if she should take medication or not. She is going to attempt to talk with him and if no answer she will call and make appt with Dr Dossie Arbour for questions

## 2021-11-09 ENCOUNTER — Encounter: Payer: Self-pay | Admitting: Obstetrics and Gynecology

## 2021-11-09 ENCOUNTER — Telehealth: Payer: Self-pay

## 2021-11-09 NOTE — Telephone Encounter (Signed)
Pt calling for Cancer screening results - [facility]; states she called [facility] and was told they sent results to [place] on 11/03/21; adv pt results are not in her chart; will send msg to the one who is in charge of this.  Pt can be reached at [phone]

## 2021-11-13 NOTE — Telephone Encounter (Signed)
Hard copy just came in. Pt aware someone should call her in the next few days.

## 2021-11-14 ENCOUNTER — Encounter: Payer: Self-pay | Admitting: Obstetrics & Gynecology

## 2021-11-28 NOTE — Progress Notes (Deleted)
Psychiatric Initial Adult Assessment   Patient Identification: Caroline Lamb MRN:  [id number] Date of Evaluation:  11/28/2021 Referral Source: *** Chief Complaint:  No chief complaint on file.  Visit Diagnosis: No diagnosis found.  History of Present Illness:   Caroline Lamb is a 61 y.o. 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     breast cancer s/p mastecomy, chemotherapy, currently on Aromasin,     Associated Signs/Symptoms: Depression Symptoms:  {DEPRESSION SYMPTOMS:20000} (Hypo) Manic Symptoms:  {BHH MANIC SYMPTOMS:22872} Anxiety Symptoms:  {BHH ANXIETY SYMPTOMS:22873} Psychotic Symptoms:  {BHH PSYCHOTIC SYMPTOMS:22874} PTSD Symptoms: {BHH PTSD SYMPTOMS:22875}  Past Psychiatric History:  Outpatient:  Psychiatry admission:  Previous suicide attempt:  Past trials of medication:  History of violence:    Previous Psychotropic Medications: {YES/NO:21197}  Substance Abuse History in the last 12 months:  {yes no:314532}  Consequences of Substance Abuse: {BHH CONSEQUENCES OF SUBSTANCE ABUSE:22880}  Past Medical History:  Past Medical History:  Diagnosis Date   Anxiety    Arthritis    uses Volteran   Asthma    controlled   BRCA negative 09/2021   MyRisk neg except ATM, VHL VUS   Breast cancer ([place]) 2019   Pt is MyRisk neg   Hyperlipidemia    Hypertension    not on medication   Hypothyroidism     Past Surgical History:  Procedure Laterality Date   cesarean     x 2   CESAREAN SECTION     COLONOSCOPY WITH PROPOFOL N/A 02/07/2015   Procedure:  COLONOSCOPY WITH PROPOFOL;  Surgeon: Hulen Luster, MD;  Location: [facility] ENDOSCOPY;  Service: Gastroenterology;  Laterality: N/A;   COLONOSCOPY WITH PROPOFOL N/A 07/10/2021   Procedure: COLONOSCOPY WITH PROPOFOL;  Surgeon: Annamaria Helling, DO;  Location[facility] ENDOSCOPY;  Service: Gastroenterology;  Laterality: N/A;   DIAGNOSTIC LAPAROSCOPY     ESOPHAGOGASTRODUODENOSCOPY N/A 07/10/2021   Procedure: ESOPHAGOGASTRODUODENOSCOPY (EGD);  Surgeon: Annamaria Helling, DO;  Location: [facility] ENDOSCOPY;  Service: Gastroenterology;  Laterality: N/A;   MASS EXCISION Bilateral 12/11/2019   Procedure: EXCISION SOFT TISSUE ANKLE RIGHT AND LEFT;  Surgeon: Samara Deist, DPM;  Location: [facility] ORS;  Service: Podiatry;  Laterality: Bilateral;   MASTECTOMY Bilateral    TENDON REPAIR Bilateral 12/11/2019   Procedure: FLEXOR TENDON REPAIR;  Surgeon: Samara Deist, DPM;  Location: [facility] ORS;  Service: Podiatry;  Laterality: Bilateral;   TONSILLECTOMY     TUBAL LIGATION      Family Psychiatric History: ***  Family History:  Family History  Problem Relation Age of Onset   Hematuria Mother    Hematuria Father    Prostate cancer Father    Kidney cancer Neg Hx    Kidney disease Neg Hx    Sickle cell trait Neg Hx     Social History:   Social History   Socioeconomic History   Marital status: Married    Spouse name: Not on file   Number of children: Not on file   Years of education: Not on file   Highest education level: Not on file  Occupational History   Not on file  Tobacco Use   Smoking status: Never  Smokeless tobacco: Never  Vaping Use   Vaping Use: Never used  Substance and Sexual Activity   Alcohol use: No    Comment: occassinally   Drug use: No   Sexual activity: Not Currently  Other Topics Concern   Not on file  Social History Narrative   Not on file   Social Determinants of Health   Financial Resource Strain: Not on file  Food Insecurity: Not on file  Transportation Needs: Not  on file  Physical Activity: Not on file  Stress: Not on file  Social Connections: Not on file    Additional Social History: ***  Allergies:   Allergies  Allergen Reactions   Contrast Media [Iodinated Contrast Media] Other (See Comments)    sneezing   Sulfa Antibiotics     Other reaction(s): Other (See Comments) sneezing    Metabolic Disorder Labs: No results found for: "HGBA1C", "MPG" No results found for: "PROLACTIN" Lab Results  Component Value Date   CHOL 241 (H) 08/08/2021   TRIG 101 08/08/2021   HDL 69 08/08/2021   CHOLHDL 3.5 08/08/2021   LDLCALC 151 (H) 08/08/2021   Lab Results  Component Value Date   TSH 4.81 (H) 08/08/2021    Therapeutic Level Labs: No results found for: "LITHIUM" No results found for: "CBMZ" No results found for: "VALPROATE"  Current Medications: Current Outpatient Medications  Medication Sig Dispense Refill   acetaminophen (TYLENOL) 500 MG tablet Take 500-1,000 mg by mouth every 6 (six) hours as needed for mild pain.     CANNABIDIOL PO Take by mouth.     Cholecalciferol (VITAMIN D3 PO) Take 2,000 Units by mouth daily.     econazole nitrate 1 % cream Apply topically 3 (three) times a week.     exemestane (AROMASIN) 25 MG tablet Take 25 mg by mouth at bedtime.      JUBLIA 10 % SOLN Apply topically.     levothyroxine (SYNTHROID) 125 MCG tablet Take 125 mcg by mouth daily.     levothyroxine (SYNTHROID) 137 MCG tablet Take 137 mcg by mouth daily.     liothyronine (CYTOMEL) 5 MCG tablet Take 5 mcg by mouth daily before breakfast.     nitrofurantoin, macrocrystal-monohydrate, (MACROBID) 100 MG capsule Take 1 capsule (100 mg total) by mouth 2 (two) times daily. 14 capsule 1   Polyethyl Glycol-Propyl Glycol (LUBRICANT EYE DROPS) 0.4-0.3 % SOLN Place 1 drop into both eyes 3 (three) times daily as needed (dry/irritated eyes.).     No current facility-administered medications for this visit.    Musculoskeletal: Strength & Muscle Tone: within  normal limits Gait & Station: normal Patient leans: N/A  Psychiatric Specialty Exam: Review of Systems  There were no vitals taken for this visit.There is no height or weight on file to calculate BMI.  General Appearance: {Appearance:22683}  Eye Contact:  {BHH EYE CONTACT:22684}  Speech:  Clear and Coherent  Volume:  Normal  Mood:  {BHH MOOD:22306}  Affect:  {Affect (PAA):22687}  Thought Process:  Coherent  Orientation:  Full (Time, Place, and Person)  Thought Content:  Logical  Suicidal Thoughts:  {ST/HT (PAA):22692}  Homicidal Thoughts:  {ST/HT (PAA):22692}  Memory:  Immediate;   Good  Judgement:  {Judgement (PAA):22694}  Insight:  {Insight (PAA):22695}  Psychomotor Activity:  Normal  Concentration:  Concentration: Good and Attention Span: Good  Recall:  Good  Fund of Knowledge:Good  Language: Good  Akathisia:  No  Handed:  Right  AIMS (if indicated):  not done  Assets:  Communication Skills Desire for Improvement  ADL's:  Intact  Cognition: WNL  Sleep:  {BHH GOOD/FAIR/POOR:22877}   Screenings: Camera operator Row Office Visit from 10/02/2021 in [place] Office Visit from 08/01/2021 in [facility]  PHQ-2 Total Score 0 0  PHQ-9 Total Score -- 0      Flowsheet Row Admission (Discharged) from 07/10/2021 in [place] ED from 05/08/2021 in [place] ED from 12/09/2020 in [facility] [place] No Risk No Risk No Risk       Assessment and Plan:  Assessment  Plan   The patient demonstrates the following risk factors for suicide: Chronic risk factors for suicide include: {Chronic Risk Factors for IWPYKDX:83382505}. Acute risk factors for suicide include: {Acute Risk Factors for LZJQBHA:19379024}. Protective factors for this patient include: {Protective Factors for Suicide OXBD:53299242}.  Considering these factors, the overall suicide risk at this point appears to be {Desc; low/moderate/high:110033}. Patient {ACTION; IS/IS AST:41962229} appropriate for outpatient follow up.   Collaboration of Care: {BH OP Collaboration of Care:21014065}  Patient/Guardian was advised Release of Information must be obtained prior to any record release in order to collaborate their care with an outside provider. Patient/Guardian was advised if they have not already done so to contact the registration department to sign all necessary forms in order for [place] to release information regarding their care.   Consent: Patient/Guardian gives verbal consent for treatment and assignment of benefits for services provided during this visit. Patient/Guardian expressed understanding and agreed to proceed.   Norman Clay, MD 10/3/20239:26 AM

## 2021-11-30 ENCOUNTER — Ambulatory Visit: Payer: Self-pay | Admitting: Psychiatry

## 2021-12-01 ENCOUNTER — Ambulatory Visit
Admission: RE | Admit: 2021-12-01 | Discharge: 2021-12-01 | Disposition: A | Discharge status: Home / Self Care | Payer: BC Managed Care – PPO | Source: Ambulatory Visit | Attending: Obstetrics & Gynecology | Admitting: Obstetrics & Gynecology

## 2021-12-01 DIAGNOSIS — N941 Unspecified dyspareunia: Secondary | ICD-10-CM | POA: Insufficient documentation

## 2021-12-01 DIAGNOSIS — R102 Pelvic and perineal pain: Secondary | ICD-10-CM | POA: Insufficient documentation

## 2022-04-15 NOTE — Progress Notes (Unsigned)
   Acute Office Visit  Subjective:     Patient ID: Caroline Lamb, female    DOB: 1960/10/29, 62 y.o.   MRN: [id number]  No chief complaint on file.   HPI Patient is in today for leg pain.   LEG CRAMPS Duration: {Blank single:19197::"chronic","days","weeks","months"} Pain: {Blank single:19197::"yes","no"} Severity: {Blank single:19197::"mild","moderate","severe","1/10","2/10","3/10","4/10","5/10","6/10","7/10","8/10","9/10","10/10"}  Quality:  {Blank multiple:19196::"sharp","dull","aching","burning","cramping","ill-defined","itchy","pressure-like","pulling","shooting","sore","stabbing","tender","tearing","throbbing"} Location:  {Blank single:19197::"lower legs","calves","thighs"} Bilateral:  {Blank single:19197::"yes","no"} Onset: {Blank single:19197::"sudden","gradual"} Frequency: {Blank single:19197::"constant","intermittent","occasional","rare","every few minutes","a few times a hour","a few times a day","a few times a week","a few times a month","a few times a year"} Time of  day:   {Blank single:19197::"night time","day time","at random"} Sudden unintentional leg jerking:   {Blank single:19197::"yes","no"} Paresthesias:   {Blank single:19197::"yes","no"} Decreased sensation:  {Blank single:19197::"yes","no"} Weakness:   {Blank single:19197::"yes","no"} Insomnia:   {Blank single:19197::"yes","no"} Fatigue:   {Blank single:19197::"yes","no"} Alleviating factors:  Aggravating factors: Status: {Blank multiple:19196::"better","worse","stable","fluctuating"} Treatments attempted:   ROS      Objective:    There were no vitals taken for this visit. {Vitals History (Optional):23777}  Physical Exam  No results found for any visits on 04/16/22.      Assessment & Plan:   Problem List Items Addressed This Visit   None   No orders of the defined types were placed in this encounter.   No follow-ups on file.  Teodora Medici, DO

## 2022-04-16 ENCOUNTER — Ambulatory Visit (INDEPENDENT_AMBULATORY_CARE_PROVIDER_SITE_OTHER): Payer: BC Managed Care – PPO | Admitting: Internal Medicine

## 2022-04-16 ENCOUNTER — Encounter: Payer: Self-pay | Admitting: Internal Medicine

## 2022-04-16 VITALS — BP 112/82 | HR 78 | Temp 98.7°F | Resp 16 | Ht 59.0 in | Wt 170.7 lb

## 2022-04-16 DIAGNOSIS — M7989 Other specified soft tissue disorders: Secondary | ICD-10-CM | POA: Diagnosis not present

## 2022-04-16 DIAGNOSIS — R350 Frequency of micturition: Secondary | ICD-10-CM

## 2022-04-16 DIAGNOSIS — M79605 Pain in left leg: Secondary | ICD-10-CM | POA: Diagnosis not present

## 2022-04-16 DIAGNOSIS — R309 Painful micturition, unspecified: Secondary | ICD-10-CM

## 2022-04-16 LAB — POCT URINALYSIS DIPSTICK
Bilirubin, UA: NEGATIVE
Glucose, UA: NEGATIVE
Ketones, UA: NEGATIVE
Leukocytes, UA: NEGATIVE
Nitrite, UA: NEGATIVE
Protein, UA: NEGATIVE
Spec Grav, UA: 1.02 (ref 1.010–1.025)
Urobilinogen, UA: 0.2 E.U./dL
pH, UA: 5.5 (ref 5.0–8.0)

## 2022-04-16 NOTE — Patient Instructions (Addendum)
It was great seeing you today!  Plan discussed at today's visit: -Ultrasound of veins and soft tissue of the lower left extremity ordered.  Please call to schedule a date and time that works best for you at [phone].  -Urine test today   Follow up in: 4 months   Take care and let us know if you have any questions or concerns prior to your next visit.  Dr. Rosana Berger

## 2022-04-17 ENCOUNTER — Ambulatory Visit
Admission: RE | Admit: 2022-04-17 | Discharge: 2022-04-17 | Disposition: A | Discharge status: Home / Self Care | Payer: BC Managed Care – PPO | Source: Ambulatory Visit | Attending: Internal Medicine | Admitting: Internal Medicine

## 2022-04-17 DIAGNOSIS — M7989 Other specified soft tissue disorders: Secondary | ICD-10-CM | POA: Diagnosis present

## 2022-04-17 DIAGNOSIS — M79605 Pain in left leg: Secondary | ICD-10-CM | POA: Diagnosis not present

## 2022-04-17 LAB — URINE CULTURE
MICRO NUMBER:: 14583400
SPECIMEN QUALITY:: ADEQUATE

## 2022-04-30 ENCOUNTER — Telehealth: Payer: Self-pay | Admitting: Internal Medicine

## 2022-04-30 NOTE — Telephone Encounter (Signed)
Copied from [place] [phone]. Topic: General - Inquiry >> Apr 30, 2022 11:42 AM Penni Bombard wrote: Reason for CRM:  pt called asking about getting an order for labs for her thyroid.    CB@  [phone]

## 2022-04-30 NOTE — Telephone Encounter (Signed)
Copied from [place] [id number]. Topic: General - Inquiry >> Apr 30, 2022 11:42 AM Penni Bombard wrote: Reason for CRM:  pt called asking about getting an order for labs for her thyroid.    CB@  [phone] >> Apr 30, 2022  4:27 PM Cyndi Bender wrote: Pt called back for an update and requested that a nurse return her call. Cb# [phone]

## 2022-05-01 ENCOUNTER — Other Ambulatory Visit: Payer: Self-pay

## 2022-05-01 DIAGNOSIS — E039 Hypothyroidism, unspecified: Secondary | ICD-10-CM

## 2022-05-02 ENCOUNTER — Other Ambulatory Visit: Payer: Self-pay | Admitting: Internal Medicine

## 2022-05-02 DIAGNOSIS — E039 Hypothyroidism, unspecified: Secondary | ICD-10-CM

## 2022-05-02 LAB — TSH+T4F+T3FREE
Free T4: 0.51 ng/dL — ABNORMAL LOW (ref 0.82–1.77)
T3, Free: 1.6 pg/mL — ABNORMAL LOW (ref 2.0–4.4)
TSH: 44.2 u[IU]/mL — ABNORMAL HIGH (ref 0.450–4.500)

## 2022-05-02 MED ORDER — LEVOTHYROXINE SODIUM 137 MCG PO TABS: 137.0000 ug | ORAL_TABLET | Freq: Every day | ORAL | 1 refills | Status: AC

## 2022-05-31 ENCOUNTER — Other Ambulatory Visit: Payer: Self-pay | Admitting: Orthopedic Surgery

## 2022-05-31 DIAGNOSIS — M2352 Chronic instability of knee, left knee: Secondary | ICD-10-CM

## 2022-05-31 DIAGNOSIS — M2392 Unspecified internal derangement of left knee: Secondary | ICD-10-CM

## 2022-05-31 DIAGNOSIS — G8929 Other chronic pain: Secondary | ICD-10-CM

## 2022-08-15 ENCOUNTER — Ambulatory Visit: Payer: BC Managed Care – PPO | Admitting: Internal Medicine

## 2023-01-31 ENCOUNTER — Ambulatory Visit (INDEPENDENT_AMBULATORY_CARE_PROVIDER_SITE_OTHER): Payer: BC Managed Care – PPO | Admitting: Obstetrics and Gynecology

## 2023-01-31 ENCOUNTER — Other Ambulatory Visit (HOSPITAL_COMMUNITY)
Admission: RE | Admit: 2023-01-31 | Discharge: 2023-01-31 | Disposition: A | Discharge status: Home / Self Care | Payer: BC Managed Care – PPO | Source: Ambulatory Visit | Attending: Obstetrics and Gynecology | Admitting: Obstetrics and Gynecology

## 2023-01-31 VITALS — BP 133/76 | HR 79 | Resp 16 | Ht 59.0 in | Wt 178.4 lb

## 2023-01-31 DIAGNOSIS — Z01419 Encounter for gynecological examination (general) (routine) without abnormal findings: Secondary | ICD-10-CM

## 2023-01-31 DIAGNOSIS — R102 Pelvic and perineal pain: Secondary | ICD-10-CM

## 2023-01-31 DIAGNOSIS — N941 Unspecified dyspareunia: Secondary | ICD-10-CM

## 2023-01-31 DIAGNOSIS — Z853 Personal history of malignant neoplasm of breast: Secondary | ICD-10-CM

## 2023-01-31 DIAGNOSIS — N898 Other specified noninflammatory disorders of vagina: Secondary | ICD-10-CM

## 2023-01-31 DIAGNOSIS — N959 Unspecified menopausal and perimenopausal disorder: Secondary | ICD-10-CM

## 2023-01-31 DIAGNOSIS — Z170 Estrogen receptor positive status [ER+]: Secondary | ICD-10-CM

## 2023-01-31 NOTE — Progress Notes (Signed)
ANNUAL PREVENTATIVE CARE GYNECOLOGY  ENCOUNTER NOTE  Subjective:       Caroline Lamb is a 62 y.o. [phone] female who was initially scheduled for an annual exam, however notes several problems today and would like to address these instead.  Previously seen by Carie Caddy, CNM of tihs practice for her last wellness exam. Notes that she has also had a recent wellness exam performed by her PCP.    Current complaints: She has been having some intermittent abdominal cramping for 1 month, painful intercourse, vaginal odor, bloating, fatigue and nausea.  Reports that she currently is being treated for a newly diagnosed UTI, for which she saw her PCP for ~ 2 days ago.   Patient has a history of breast cancer (s/p double mastectomy, radiation), hypothyroidism, chronic pain, and depression.  Notes that she would like to address her postmenopausal symptoms. States that she just does not feel like "a woman". Denies hot flushes, night sweats, or vaginal dryness, but just does not feel like her "old self". Notes she has been seen by an Endocrinologist, her Oncologist, and her therapist, who all note that this just may be something she has to deal with. Reports having a full hormonal panel performed in August (in [place] where she is from).  Notes that she would like discuss having a hysterectomy.  Due to her history of breast cancer, she would like to eliminate further risk of cancer.  Reports that she had BRCA testing done which was negative, but still wants to be proactive in decreasing her risk.  Made personal decision to discontinue aromatase therapy as she did not feel that it would significantly change her outcome in reducing her risk of recurrence.     Gynecologic History No LMP recorded. Patient is postmenopausal. Contraception: post menopausal status Last Pap: 08/22/2021. Results were: abnormal Last mammogram: Hx of breast cancer, no breast Last Colonoscopy: 06/30/2021: 10 years Last Dexa Scan:  ~1-2 years ago.   Obstetric History OB History  Gravida Para Term Preterm AB Living  3 2 2   1 2   SAB IAB Ectopic Multiple Live Births  1            # Outcome Date GA Lbr Len/2nd Weight Sex Type Anes PTL Lv  3 SAB           2 Term      CS-Unspec     1 Term      CS-Unspec       Past Medical History:  Diagnosis Date   Anxiety    Arthritis    uses Volteran   Asthma    controlled   BRCA negative 09/2021   MyRisk neg except ATM, VHL VUS   Breast cancer ([facility]) 2019   Pt is MyRisk neg   Hyperlipidemia    Hypertension    not on medication   Hypothyroidism     Family History  Problem Relation Age of Onset   Hematuria Mother    Hematuria Father    Prostate cancer Father    Kidney cancer Neg Hx    Kidney disease Neg Hx    Sickle cell trait Neg Hx     Past Surgical History:  Procedure Laterality Date   cesarean     x 2   CESAREAN SECTION     COLONOSCOPY WITH PROPOFOL N/A 02/07/2015   Procedure: COLONOSCOPY WITH PROPOFOL;  Surgeon: Wallace Cullens, MD;  Location: [facility] ENDOSCOPY;  Service: Gastroenterology;  Laterality: N/A;  COLONOSCOPY WITH PROPOFOL N/A 07/10/2021   Procedure: COLONOSCOPY WITH PROPOFOL;  Surgeon: Jaynie Collins, DO;  Location: [facility] ENDOSCOPY;  Service: Gastroenterology;  Laterality: N/A;   DIAGNOSTIC LAPAROSCOPY     ESOPHAGOGASTRODUODENOSCOPY N/A 07/10/2021   Procedure: ESOPHAGOGASTRODUODENOSCOPY (EGD);  Surgeon: Jaynie Collins, DO;  Location: [facility] ENDOSCOPY;  Service: Gastroenterology;  Laterality: N/A;   MASS EXCISION Bilateral 12/11/2019   Procedure: EXCISION SOFT TISSUE ANKLE RIGHT AND LEFT;  Surgeon: Gwyneth Revels, DPM;  Location: [facility] ORS;  Service: Podiatry;  Laterality: Bilateral;   MASTECTOMY Bilateral    TENDON REPAIR Bilateral 12/11/2019   Procedure: FLEXOR TENDON REPAIR;  Surgeon: Gwyneth Revels, DPM;  Location: [facility] ORS;  Service: Podiatry;  Laterality: Bilateral;   TONSILLECTOMY     TUBAL LIGATION      Social History    Socioeconomic History   Marital status: Married    Spouse name: Not on file   Number of children: Not on file   Years of education: Not on file   Highest education level: Not on file  Occupational History   Not on file  Tobacco Use   Smoking status: Never   Smokeless tobacco: Never  Vaping Use   Vaping status: Never Used  Substance and Sexual Activity   Alcohol use: No    Comment: occassinally   Drug use: No   Sexual activity: Not Currently  Other Topics Concern   Not on file  Social History Narrative   Not on file   Social Determinants of Health   Financial Resource Strain: Not on file  Food Insecurity: No Food Insecurity (01/29/2023)   Received from [facility]   Hunger Vital Sign    Worried About Running Out of Food in the Last Year: Never true    Ran Out of Food in the Last Year: Never true  Transportation Needs: No Transportation Needs (01/29/2023)   Received [facility]   PRAPARE - Transportation    Lack of Transportation (Medical): No    Lack of Transportation (Non-Medical): No  Physical Activity: Not on file  Stress: Not on file  Social Connections: Not on file  Intimate Partner Violence: Not on file    Current Outpatient Medications on File Prior to Visit  Medication Sig Dispense Refill   acetaminophen (TYLENOL) 500 MG tablet Take 500-1,000 mg by mouth every 6 (six) hours as needed for mild pain.     CANNABIDIOL PO Take by mouth.     Cholecalciferol (VITAMIN D3 PO) Take 2,000 Units by mouth daily.     econazole nitrate 1 % cream Apply topically 3 (three) times a week.     exemestane (AROMASIN) 25 MG tablet Take 25 mg by mouth at bedtime.      JUBLIA 10 % SOLN Apply topically.     levothyroxine (SYNTHROID) 137 MCG tablet Take 1 tablet (137 mcg total) by mouth daily. 30 tablet 1   liothyronine (CYTOMEL) 5 MCG tablet Take 5 mcg by mouth daily before breakfast.     nitrofurantoin, macrocrystal-monohydrate, (MACROBID) 100 MG capsule Take 1  capsule (100 mg total) by mouth 2 (two) times daily. 14 capsule 1   Polyethyl Glycol-Propyl Glycol (LUBRICANT EYE DROPS) 0.4-0.3 % SOLN Place 1 drop into both eyes 3 (three) times daily as needed (dry/irritated eyes.).     No current facility-administered medications on file prior to visit.    Allergies  Allergen Reactions   Contrast Media [Iodinated Contrast Media] Other (See Comments)    sneezing  Sulfa Antibiotics     Other reaction(s): Other (See Comments) sneezing      Review of Systems ROS Review of Systems - General ROS: negative for - chills, fever, hot flashes, night sweats, weight gain or weight loss.  Positive for fatigue. Psychological ROS: negative for - anxiety, decreased libido, mood swings, physical abuse or sexual abuse. Positive for history of depression (not currently on medication) Ophthalmic ROS: negative for - blurry vision, eye pain or loss of vision ENT ROS: negative for - headaches, hearing change, visual changes or vocal changes Allergy and Immunology ROS: negative for - hives, itchy/watery eyes or seasonal allergies Hematological and Lymphatic ROS: negative for - bleeding problems, bruising, swollen lymph nodes or weight loss Endocrine ROS: negative for - galactorrhea, hair pattern changes, hot flashes, malaise/lethargy, mood swings, palpitations, polydipsia/polyuria, skin changes, temperature intolerance or unexpected weight changes Breast ROS: negative for - new or changing breast lumps or nipple discharge Respiratory ROS: negative for - cough or shortness of breath Cardiovascular ROS: negative for - chest pain, irregular heartbeat, palpitations or shortness of breath Gastrointestinal ROS: Positive for  abdominal pain (intermittent), nausea, bloating.  Negative for change in bowel habits, or black or bloody stools Genito-Urinary ROS: no dysuria, trouble voiding, or hematuria. Positive for vaginal odor.  Musculoskeletal ROS: negative for - joint pain or  joint stiffness Neurological ROS: negative for - bowel and bladder control changes Dermatological ROS: negative for rash and skin lesion changes   Objective:   BP 133/76   Pulse 79   Resp 16   Ht 4\' 11"  (1.499 m)   Wt 178 lb 6.4 oz (80.9 kg)   BMI 36.03 kg/m  CONSTITUTIONAL: Well-developed, well-nourished female in no acute distress.  PSYCHIATRIC: Normal affect, mildly depressed mood. Normal behavior. Normal judgment and thought content. NEUROLGIC: Alert and oriented to person, place, and time. Normal muscle tone coordination. No cranial nerve deficit noted. BREASTS: Deferred ABDOMEN: Soft, normal bowel sounds, no distention noted.  No tenderness, rebound or guarding.  BLADDER: Normal PELVIC:  Bladder no bladder distension noted  Urethra: normal appearing urethra with no masses, tenderness or lesions  Vulva: normal appearing vulva with no masses, tenderness or lesions  Vagina: mildly atrophic appearing vagina with small amount of thin white discharge, no lesions  Cervix: normal appearing cervix without discharge or lesions  Uterus: uterus is normal size, shape, consistency and nontender  Adnexa: normal adnexa in size, nontender and no masses MUSCULOSKELETAL: Normal range of motion. No tenderness.  No cyanosis, clubbing, or edema.  2+ distal pulses.   Assessment:   1. Menopausal problem   2. Vaginal odor   3. Pelvic pain   4. Dyspareunia, female   5. Malignant neoplasm of lower-outer quadrant of left breast of female, estrogen receptor positive (HCC)      Plan:  1. Menopausal problem - Patient desires to discuss ways to "feel better, more like herself".  Feels that it is due to menopause and the lack of estrogen. Knows she cannot utilize hormones due to her breast cancer.  Notes having many labs drawn several months ago but feels she is deficient.  Advised patient to bring labs to next visit for review and discuss alternative options for her symptoms.   2. Vaginal odor -  Cervicovaginal ancillary only - Discussed option to treat empirically for suspected bacterial vaginosis, or await culture results. Patient ok to go ahead and treat.   3. Pelvic pain - May be secondary to suspected vaginal infection, or possible cyst  formation. Will order elvic ultrasound.  - US PELVIC COMPLETE WITH TRANSVAGINAL; Future  4. Dyspareunia, female - May be secondary to suspected vaginal infection, or could be related to vaginal atrophy. Will await culture results.   5. Malignant neoplasm of lower-outer quadrant of left breast of female, estrogen receptor positive (HCC) - Patient with h/o breast cancer  s/p treatment.  Has concerns about future reproductive cancers despite negative hereditary cancer screening. Desires full hysterectomy with removal of ovaries. Discussed that this measure was not necessary, however tried to allay patient's concerns. Also discussed that a full hysterectomy was not necessary, if she was concerned about cancer risk due to her history of breast cancer, could consider a less invasive surgery such as bilateral oophorectomy, as this can result I a less invasive procedure and shortened recovery time. Patient notes she will think about this.   6.  Hypothyroidism - History of hypothyroidism, currently on medication.  Last TSH last month was normal.     RTC in 2-3 weeks.    A total of 42 minutes were spent during this encounter, including review of previous progress notes, recent imaging and labs, face-to-face with time with patient involving counseling and coordination of care, as well as documentation for current visit.   Hildred Laser, MD [facility]

## 2023-02-01 ENCOUNTER — Encounter: Payer: Self-pay | Admitting: Obstetrics and Gynecology

## 2023-02-04 LAB — CERVICOVAGINAL ANCILLARY ONLY
Bacterial Vaginitis (gardnerella): NEGATIVE
Comment: NEGATIVE

## 2023-02-04 MED ORDER — KETAMINE HCL 50 MG/5ML IJ SOSY
PREFILLED_SYRINGE | INTRAMUSCULAR | Status: AC
  Filled 2023-02-04: qty 15

## 2023-02-21 ENCOUNTER — Other Ambulatory Visit: Payer: BC Managed Care – PPO

## 2023-02-22 ENCOUNTER — Ambulatory Visit
Admission: RE | Admit: 2023-02-22 | Discharge: 2023-02-22 | Disposition: A | Discharge status: Home / Self Care | Payer: BC Managed Care – PPO | Source: Ambulatory Visit | Attending: Obstetrics and Gynecology | Admitting: Obstetrics and Gynecology

## 2023-02-22 DIAGNOSIS — R102 Pelvic and perineal pain unspecified side: Secondary | ICD-10-CM

## 2023-02-25 ENCOUNTER — Encounter: Payer: Self-pay | Admitting: Obstetrics and Gynecology

## 2023-02-28 ENCOUNTER — Other Ambulatory Visit: Payer: BC Managed Care – PPO

## 2023-02-28 ENCOUNTER — Ambulatory Visit: Payer: BC Managed Care – PPO | Admitting: Obstetrics and Gynecology

## 2023-03-15 ENCOUNTER — Ambulatory Visit: Payer: BC Managed Care – PPO | Admitting: Obstetrics and Gynecology

## 2023-03-15 NOTE — Progress Notes (Deleted)
    GYNECOLOGY PROGRESS NOTE  Subjective:    Patient ID: Caroline Lamb, female    DOB: 27-Mar-1960, 63 y.o.   MRN: [id number]  HPI  Patient is a 63 y.o. [id number] female who presents for follow up after ultrasound. She has been having pelvic pain for sometime, ultrasound was ordered.  {Common ambulatory SmartLinks:19316}  Review of Systems {ros; complete:30496}   Objective:   There were no vitals taken for this visit. There is no height or weight on file to calculate BMI. General appearance: {general exam:16600} Abdomen: {abdominal exam:16834} Pelvic: {pelvic exam:16852::"cervix normal in appearance","external genitalia normal","no adnexal masses or tenderness","no cervical motion tenderness","rectovaginal septum normal","uterus normal size, shape, and consistency","vagina normal without discharge"} Extremities: {extremity exam:5109} Neurologic: {neuro exam:17854}   Ultrasound:  PROCEDURE: US PELVIS COMPLETE WITH TRANSVAGINAL   HISTORY: Patient is a 63 y/o  F with pelvic pain (left-sided).   COMPARISON: US Pelvis 12/01/2021, CT A/P 02/04/2017.   TECHNIQUE: Two-dimensional transabdominal grayscale and color Doppler ultrasound of the pelvis was performed. Transvaginal was performed.   FINDINGS: The uterus is anteverted in position and measures 8.3 x 3.5 x 4.8 cm. It demonstrates a heterogeneous echotexture without definite fibroid. The endometrium measures 0.5 cm and demonstrates a normal homogeneous echotexture. Nabothian cysts are visualized within the cervix.   The right ovary measures 1.7 x 0.9 x 1.6 cm and demonstrates a normal echotexture. There is normal color Doppler flow.   The left ovary measures 1.5 x 0.5 x 1.4 cm and demonstrates a normal echotexture. There is normal color Doppler flow.   There is no fluid present within the cul-de-sac.   IMPRESSION: 1. Heterogenous uterine echotexture without definite fibroid.   2.  Unremarkable bilateral  ovaries.  Assessment:   1. Hypothyroidism, unspecified type   2. Hypothyroidism due to Hashimoto's thyroiditis      Plan:   1. Hypothyroidism, unspecified type (Primary) ***  2. Hypothyroidism due to Hashimoto's thyroiditis ***      Hildred Laser, MD [facility]

## 2023-03-20 ENCOUNTER — Encounter: Payer: Self-pay | Admitting: Obstetrics and Gynecology

## 2023-06-19 ENCOUNTER — Encounter

## 2023-07-03 ENCOUNTER — Encounter

## 2023-10-29 IMAGING — US US ABDOMEN LIMITED
1 series · 15 of 25 positions shown · non-contrast
Comparison: None.

CLINICAL DATA: Right upper quadrant pain for 2 weeks

EXAM:
ULTRASOUND ABDOMEN LIMITED RIGHT UPPER QUADRANT

[Series 1: us abdomen limited ruq · 15 of 48 slices shown]
[im 1/48]
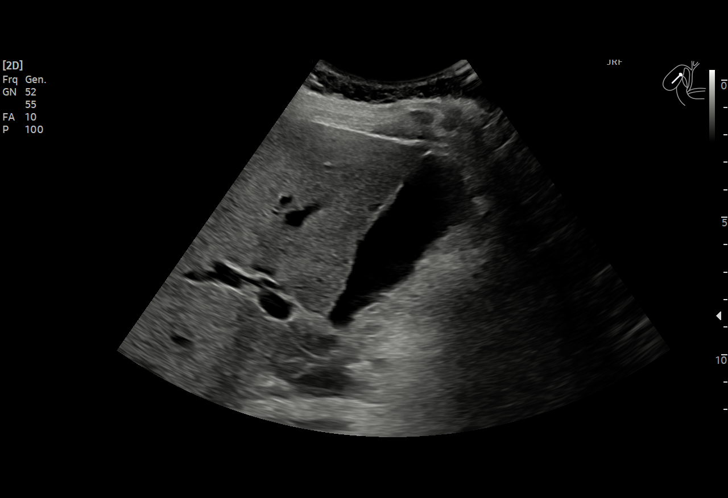
[im 4/48]
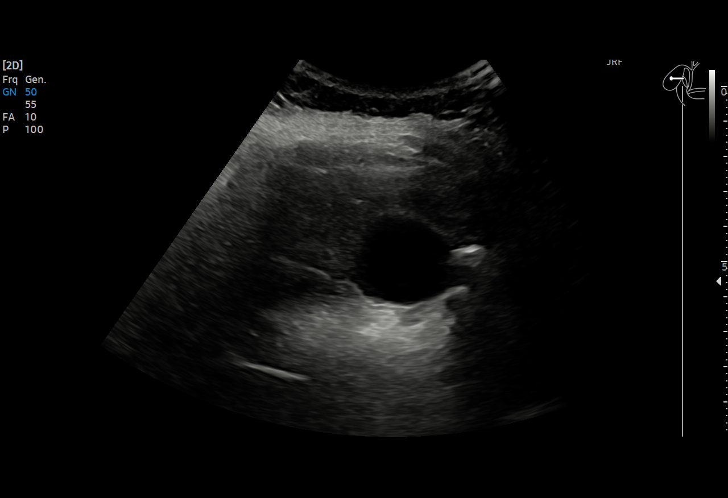
[im 8/48]
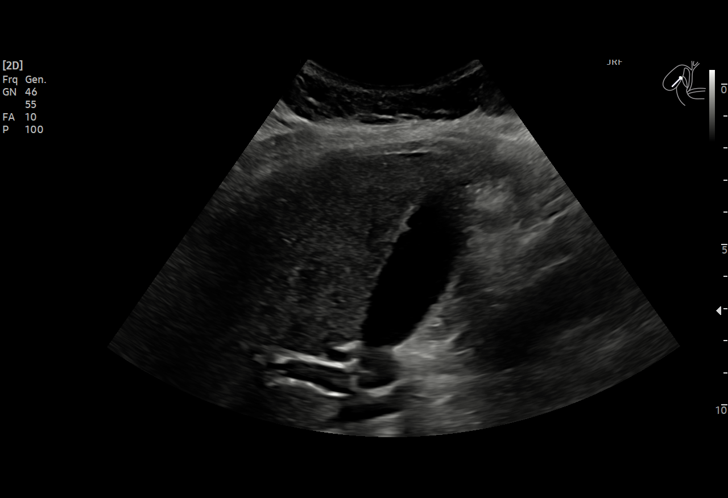
[im 10/48]
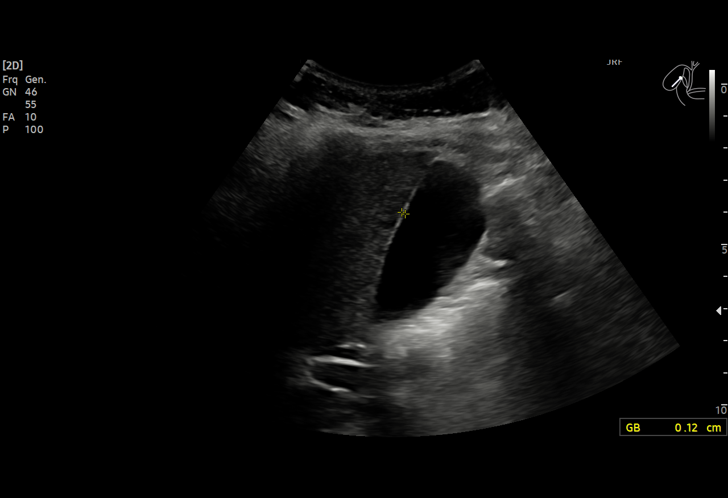
[im 14/48]
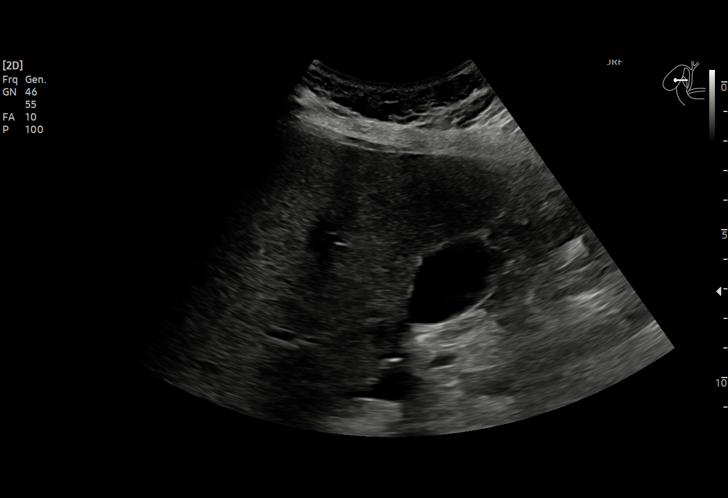
[im 18/48]
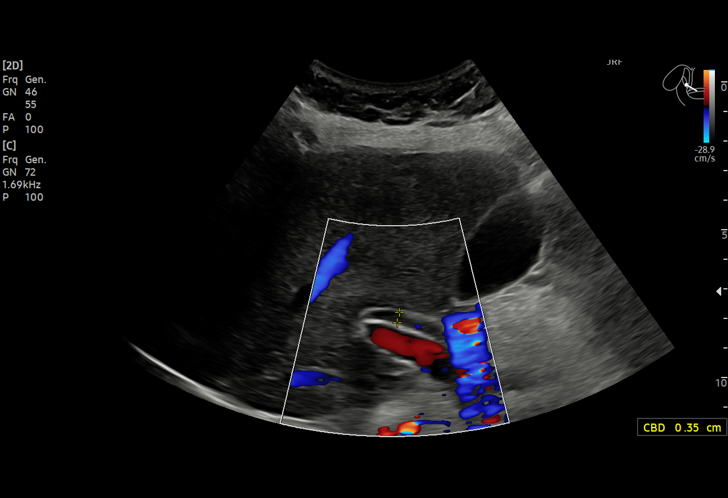
[im 20/48]
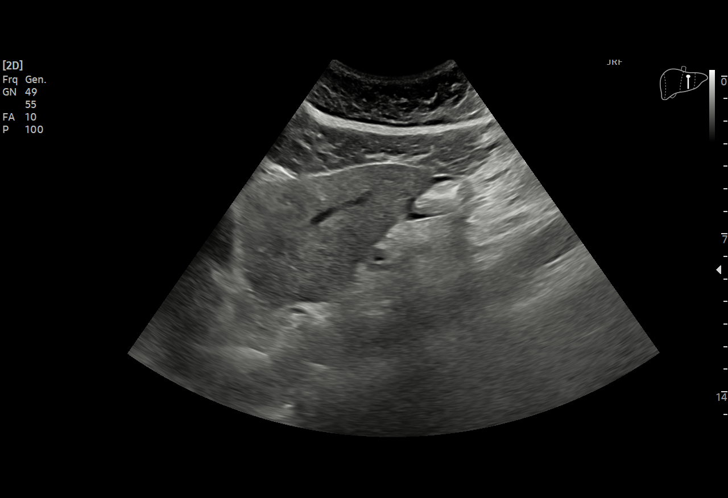
[im 24/48]
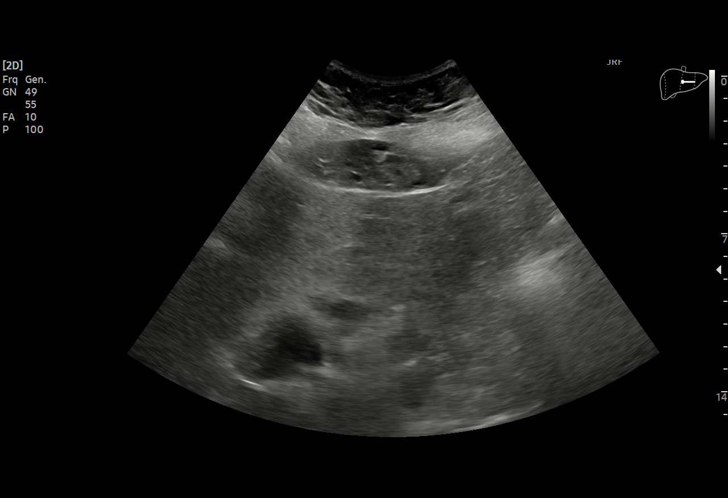
[im 28/48]
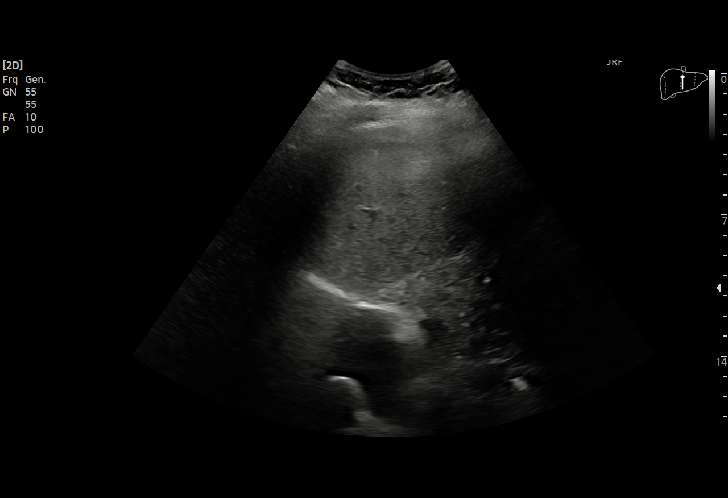
[im 30/48]
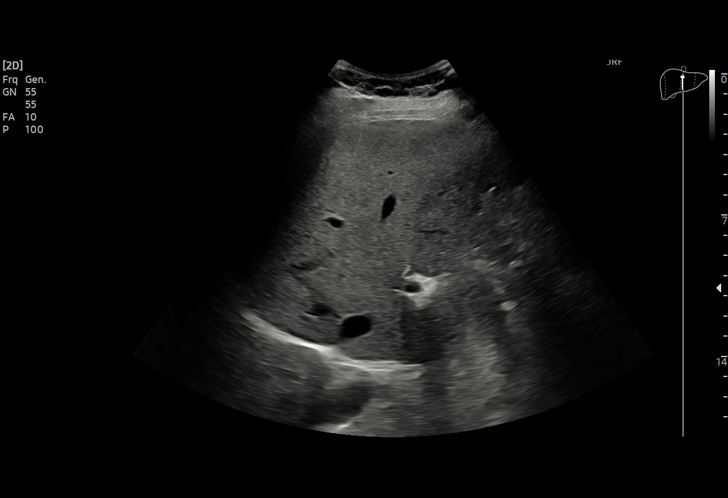
[im 34/48]
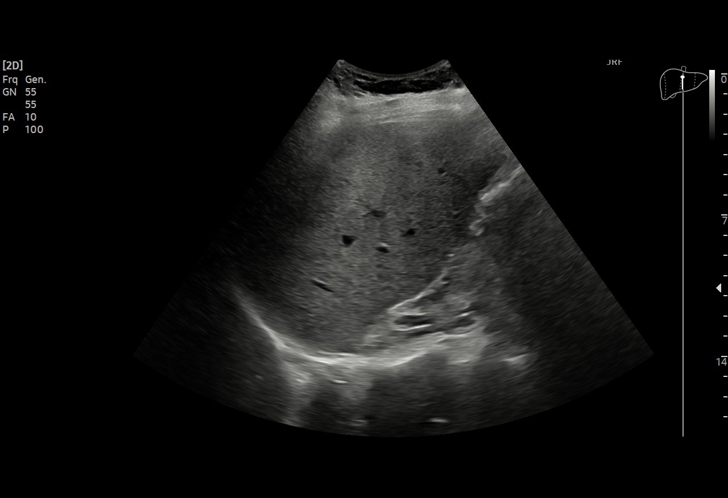
[im 38/48]
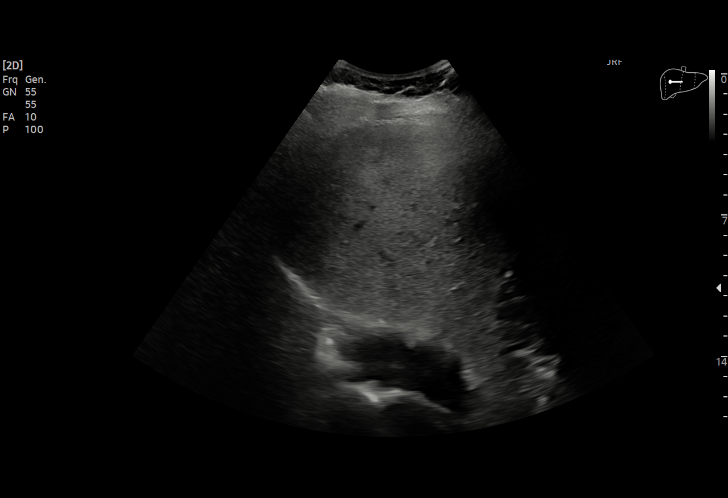
[im 40/48]
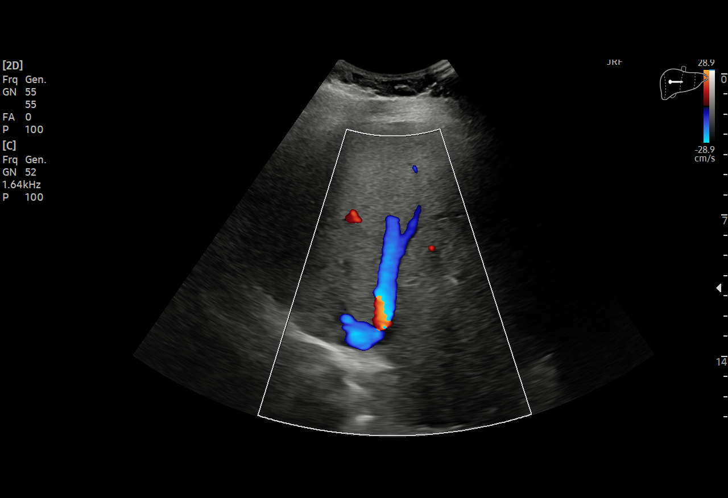
[im 44/48]
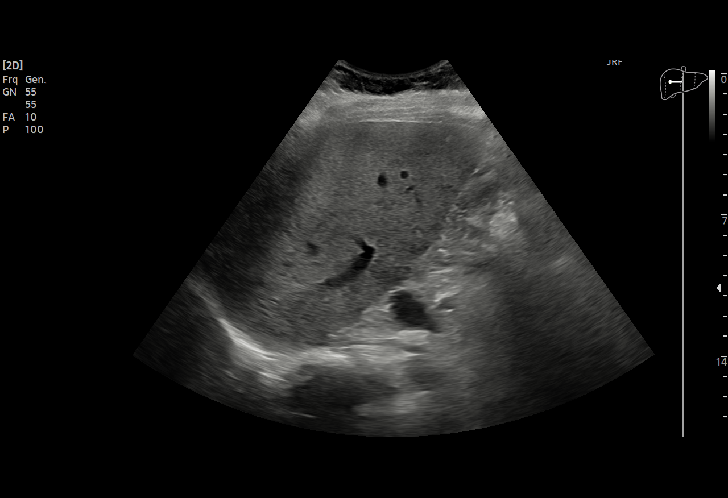
[im 48/48]
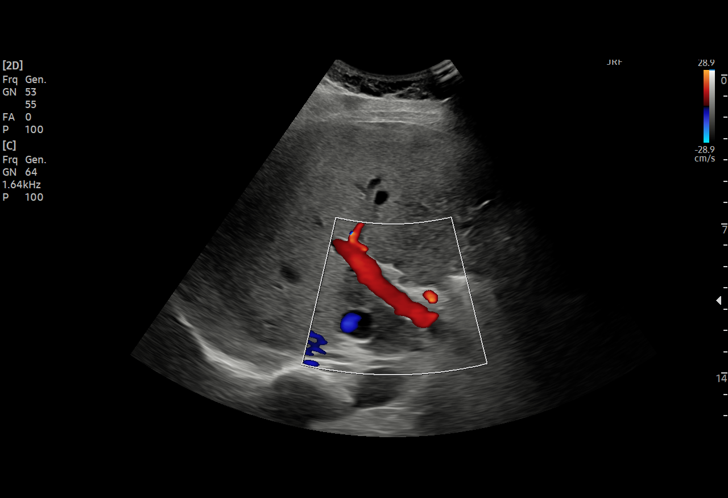

[15 of 25 positions shown; findings below may reference images not displayed]

FINDINGS: Gallbladder:

No gallstones or wall thickening visualized. No sonographic Murphy
sign noted by sonographer.

Common bile duct:

Diameter: 4 mm

Liver:

No focal lesion identified. Within normal limits in parenchymal
echogenicity. Portal vein is patent on color Doppler imaging with
normal direction of blood flow towards the liver.

Other: None.
IMPRESSION: No ultrasound findings of the right upper quadrant to explain pain.
Consider CT or MRI to further evaluate otherwise unexplained
abdominal pain.
